# Patient Record
Sex: Female | Born: 1960 | Race: Black or African American | Hispanic: No | Smoking: Current every day smoker
Health system: Southern US, Community
[De-identification: ages and names within clinical notes are randomized; demographics above are authoritative.]

## PROBLEM LIST (undated history)

## (undated) DIAGNOSIS — A599 Trichomoniasis, unspecified: Secondary | ICD-10-CM

## (undated) DIAGNOSIS — I10 Essential (primary) hypertension: Secondary | ICD-10-CM

## (undated) DIAGNOSIS — D259 Leiomyoma of uterus, unspecified: Secondary | ICD-10-CM

## (undated) DIAGNOSIS — Z9889 Other specified postprocedural states: Secondary | ICD-10-CM

## (undated) DIAGNOSIS — H269 Unspecified cataract: Secondary | ICD-10-CM

## (undated) DIAGNOSIS — R112 Nausea with vomiting, unspecified: Secondary | ICD-10-CM

## (undated) HISTORY — DX: Essential (primary) hypertension: I10

## (undated) HISTORY — PX: ABDOMINAL HYSTERECTOMY: SHX81

## (undated) HISTORY — DX: Trichomoniasis, unspecified: A59.9

## (undated) HISTORY — PX: TUBAL LIGATION: SHX77

---

## 2010-11-21 DIAGNOSIS — A599 Trichomoniasis, unspecified: Secondary | ICD-10-CM

## 2010-11-21 HISTORY — DX: Trichomoniasis, unspecified: A59.9

## 2011-12-28 ENCOUNTER — Encounter (HOSPITAL_COMMUNITY): Payer: Self-pay | Admitting: Pharmacy Technician

## 2011-12-30 ENCOUNTER — Encounter (HOSPITAL_COMMUNITY): Payer: Self-pay | Admitting: Pharmacy Technician

## 2012-01-04 ENCOUNTER — Other Ambulatory Visit: Payer: Self-pay | Admitting: Obstetrics and Gynecology

## 2012-01-04 ENCOUNTER — Encounter: Payer: Self-pay | Admitting: Obstetrics and Gynecology

## 2012-01-04 NOTE — H&P (Signed)
Kim Richardson is an 51 y.o. female. She is admitted on 01/10/2012 for supracervical abdominal hysterectomy through a midline vertical incision due to symptomatic uterine fibroids. Plans are for preservation of the cervix as well as the tubes and ovaries unless distinct abnormalities are identified at surgery. She has had an episode of postmenopausal bleeding, with a negative endometrial biopsy. At last Pap smear was normal 2010 with no history of abnormal Paps normal Pap smear has been found in the chart dated 06/17/2011, also from [facility] .technical aspects of the procedure including plans for preservation of the ovaries and cervix unless abnormalities have been identified at the time of surgery. Patient has very specific in her comments that she "don't want no full hysterectomy" ultrasound has revealed multiple uterine fibroids. Uterine length was 16 cm maximum length., And patient complains of significant pressure symptoms from the fibroid enlargement of the uterus. Technical aspects of the procedure have been reviewed using [employer] instructional booklets   Pertinent Gynecological History: Menses: post-menopausal Bleeding: post menopausal bleeding with recent negative endometrial biopsy  Contraception: post menopausal status prior tubal ligation  DES exposure: denies Blood transfusions: none Sexually transmitted diseases: no past history Previous GYN Procedures: Cesarean section x3 with tubal ligation  Last mammogram: Records unavailable Date:  Last pap: normal Date:  06/17/2011 OB History: G3, P3   Menstrual History: Menarche age:  No LMP recorded.    No past medical history on file.  No past surgical history on file.  No family history on file.  Social History:  does not have a smoking history on file. She does not have any smokeless tobacco history on file. Her alcohol and drug histories not on file.  Allergies: No Known Allergies   (Not in a hospital  admission)  ROS  There were no vitals taken for this visit. Physical ExamPhysical Examination: General appearance - alert, well appearing, and in no distress, oriented to person, place, and time, overweight and weight 182 pounds Mental status - alert, oriented to person, place, and time, normal mood, behavior, speech, dress, motor activity, and thought processes, affect appropriate to mood Mouth - dental hygiene good and Good oral airway Neck - supple, no significant adenopathy Chest - clear to auscultation, no wheezes, rales or rhonchi, symmetric air entry Heart - normal rate and regular rhythm Abdomen - scars from previous incisions midline vertical under the umbilicus Pelvic - VULVA: normal appearing vulva with no masses, tenderness or lesions, VAGINA: normal appearing vagina with normal color and discharge, no lesions, CERVIX: normal appearing cervix without discharge or lesions, UTERUS: enlarged to 20+ week's size, irregular, mobile, ADNEXA: normal adnexa in size, nontender and no masses, exam limited by fibroid uterus, but negative on ultrasound,  Extremities - peripheral pulses normal, no pedal edema, no clubbing or cyanosis, Homan's sign negative bilaterally  No results found for this or any previous visit (from the past 24 hour(s)).  No results found.  Assessment/Plan:  dramatic uterine fibroids 20+ weeks uterine size, symptomatic due to pressure on adjacent organs, scheduled for supracervical abdominal hysterectomy with planned preservation of both ovaries and cervix provided: Normal findings at surgery. Planned procedure with potential complications such as bleeding and need for transfusions infection operative discomfort discussed with patient at preoperative evaluation  Priyal Musquiz V 01/04/2012, 8:39 AM

## 2012-01-05 ENCOUNTER — Other Ambulatory Visit: Payer: Self-pay

## 2012-01-05 ENCOUNTER — Encounter (HOSPITAL_COMMUNITY)
Admission: RE | Admit: 2012-01-05 | Discharge: 2012-01-05 | Disposition: A | Discharge status: Home / Self Care | Payer: Self-pay | Source: Ambulatory Visit | Attending: Obstetrics and Gynecology | Admitting: Obstetrics and Gynecology

## 2012-01-05 ENCOUNTER — Encounter (HOSPITAL_COMMUNITY): Payer: Self-pay

## 2012-01-05 HISTORY — DX: Nausea with vomiting, unspecified: Z98.890

## 2012-01-05 HISTORY — DX: Nausea with vomiting, unspecified: R11.2

## 2012-01-05 HISTORY — DX: Unspecified cataract: H26.9

## 2012-01-05 HISTORY — DX: Leiomyoma of uterus, unspecified: D25.9

## 2012-01-05 LAB — CBC
HCT: 43.4 % (ref 36.0–46.0)
Hemoglobin: 14.4 g/dL (ref 12.0–15.0)
MCH: 30.7 pg (ref 26.0–34.0)
MCV: 92.5 fL (ref 78.0–100.0)
Platelets: 234 10*3/uL (ref 150–400)
RBC: 4.69 MIL/uL (ref 3.87–5.11)

## 2012-01-05 LAB — TYPE AND SCREEN

## 2012-01-05 LAB — URINALYSIS, ROUTINE W REFLEX MICROSCOPIC
Nitrite: POSITIVE — AB
Specific Gravity, Urine: 1.025 (ref 1.005–1.030)
pH: 5.5 (ref 5.0–8.0)

## 2012-01-05 LAB — BASIC METABOLIC PANEL
CO2: 26 mEq/L (ref 19–32)
Calcium: 9.6 mg/dL (ref 8.4–10.5)
Glucose, Bld: 104 mg/dL — ABNORMAL HIGH (ref 70–99)
Sodium: 140 mEq/L (ref 135–145)

## 2012-01-05 LAB — SURGICAL PCR SCREEN
MRSA, PCR: NEGATIVE
Staphylococcus aureus: POSITIVE — AB

## 2012-01-05 LAB — URINE MICROSCOPIC-ADD ON

## 2012-01-05 NOTE — Patient Instructions (Signed)
19 Kim Richardson  01/05/2012   Your procedure is scheduled on:  Tuesday, 01/10/12  Report to [facility] at 0855 AM.  Call this number if you have problems the morning of surgery: [phone]   Remember:   Do not eat food:After Midnight.  May have clear liquids:until Midnight .  Clear liquids include soda, tea, black coffee, apple or grape juice, broth.  Take these medicines the morning of surgery with A SIP OF WATER: Avapro   Do not wear jewelry, make-up or nail polish.  Do not wear lotions, powders, or perfumes. You may wear deodorant.  Do not shave 48 hours prior to surgery.  Do not bring valuables to the hospital.  Contacts, dentures or bridgework may not be worn into surgery.  Leave suitcase in the car. After surgery it may be brought to your room.  For patients admitted to the hospital, checkout time is 11:00 AM the day of discharge.   Patients discharged the day of surgery will not be allowed to drive home.  Name and phone number of your driver: driver  Special Instructions: CHG Shower Use Special Wash: 1/2 bottle night before surgery and 1/2 bottle morning of surgery. Use bowel prep as prescribed by DR. Ferguson. [facility].)   Please read over the following fact sheets that you were given: Pain Booklet, MRSA Information, Surgical Site Infection Prevention, Anesthesia Post-op Instructions and Care and Recovery After Surgery   Supracervical Hysterectomy The uterus is the female organ that produces menstrual periods and carries a baby. The body of the uterus is the top part that is in the pelvis. The cervix (the opening or neck) is the bottom part that protrudes into the top of the vagina. A supracervical hysterectomy is the removal of the body of the uterus but not the cervix. This procedure can be performed with a large abdominal incision. It can also be done with a long tube with a light (laparoscopy) inserted into two small incisions in the lower abdomen. The tubes and ovaries can be  removed during this operation if necessary. You will not have menstrual periods or be able to get pregnant after having this procedure.  Women who are going to have this procedure should be tested to make sure they have a normal Pap test and no signs of precancer or cancer disease of the cervix or uterus. You should also know about the possibility of problems developing on the cervix later, which may need more surgery to remove the cervix.  Reasons this procedure is done:  Endometriosis. This is when the lining of the inside of the uterus is misplaced outside of its normal location.   Adenomyosis. This is when endometrial tissue goes in the muscle of the uterus.   Persistent abnormal bleeding.   Lasting (chronic) pelvic pain.   Uterine prolapse. This is when the uterus falls down into the vagina.  LET YOUR CAREGIVER KNOW ABOUT:  Allergies especially to any medications.   All the medications you are taking including over-the-counter medication, herbs, eye drops and creams.   Past problems with anesthesia or novocaine.   Possibility of being pregnant.   All past surgeries.   History of blood clots or bleeding problems.   Other medical problems (diabetes, kidney, heart or lung problems).  RISKS AND COMPLICATIONS  Blood clots of the leg, heart or lung.   Infection.   Severe bleeding.   Injury to other surrounding organs.   Early menopause.   Problems with the anesthesia.   More  surgery later to remove the cervix if you have problems with the cervix.  BEFORE THE PROCEDURE  Do not take aspirin or blood thinners a week before the surgery or as told by your caregiver.   Do not eat or drink anything the night before the surgery or as told by your caregiver.   Let your caregiver know if you develop a cold or any other infection.   If you are admitted the day of the surgery, show up 60 minutes before the surgery or as directed by your caregiver.  PROCEDURE  An intravenous  (IV) will be placed in your arm. You will be given an anesthetic to make you sleep during the surgery. You may be given a spinal anesthesia to numb your body from the waist down. When you wake up, you will still have the IV and also have a Foley catheter in you. A Foley catheter will drain your bladder for a day or two.  AFTER THE PROCEDURE  You will be taken to the recovery room for a couple of hours until it is safe to take you to your hospital room. Usually, you will remain in the hospital for 3 to 5 days. You may be given a medicine that kills germs (antibiotic) during and after the surgery. Pain medication will be ordered for you by your caregiver while you are in the hospital and for when you go home. HOME CARE INSTRUCTIONS  Healing will take time. You will have discomfort, tenderness, swelling and bruising at the operative site for a couple of weeks. This is normal and will get better as time goes on.   Only take over-the-counter or prescription medicines for pain, discomfort or fever as directed by your caregiver.   Do not take aspirin. It can cause bleeding.   Do not drive when taking pain medication.   Follow your caregiver's advice regarding diet, exercise, lifting, driving and general activities.   Resume your usual diet as directed and allowed.   Get plenty of rest and sleep.   Do not douche, use tampons, or have sexual intercourse until your caregiver gives you permission.   Change your bandages (dressings) as directed.   Take your temperature twice a day. Write it down.   Your caregiver may recommend showers instead of baths for a few weeks.   Do not drink alcohol until your caregiver gives you permission.   If you develop constipation, you may take a mild laxative with your caregiver's permission. Bran foods and drinking fluids helps with constipation problems.   Try to have someone home with you for a week or two to help with the household activities.   Make sure you  and your family understands everything about your operation and recovery.   Do not sign any legal documents until you feel normal again.   Keep all your follow-up appointments as recommended by your caregiver.  SEEK MEDICAL CARE IF:   There is swelling, redness or increasing pain in the wound area.   Pus is coming from the wound.   You notice a bad smell from the wound or surgical dressing.   You have pain, redness and swelling from the intravenous site.   The wound is breaking open (the edges are not staying together).   You feel dizzy or feel like fainting.   You develop pain or bleeding when you urinate.   You develop diarrhea.   You develop nausea and vomiting.   You develop abnormal vaginal discharge.  You develop a rash.   You have any type of abnormal reaction or develop an allergy to your medication.   You need stronger pain medication for your pain.  SEEK IMMEDIATE MEDICAL CARE IF:  You have an oral temperature above 102 F (38.9 C), not controlled by medicine.   You develop abdominal pain.   You develop chest pain.   You develop shortness of breath.   You pass out.   You develop pain, swelling or redness of your leg.   You develop heavy vaginal bleeding with or without blood clots.  Document Released: 04/25/2008 Document Revised: 03/04/2011 Document Reviewed: 03/25/2008 [facility] Patient Information 2012 [place], [place].

## 2012-01-10 ENCOUNTER — Encounter (HOSPITAL_COMMUNITY): Payer: Self-pay | Admitting: Anesthesiology

## 2012-01-10 ENCOUNTER — Encounter (HOSPITAL_COMMUNITY): Payer: Self-pay | Admitting: *Deleted

## 2012-01-10 ENCOUNTER — Inpatient Hospital Stay (HOSPITAL_COMMUNITY)
Admission: RE | Admit: 2012-01-10 | Discharge: 2012-01-12 | DRG: 743 | Disposition: A | Discharge status: Home / Self Care | Payer: Self-pay | Source: Ambulatory Visit | Attending: Obstetrics and Gynecology | Admitting: Obstetrics and Gynecology

## 2012-01-10 ENCOUNTER — Other Ambulatory Visit: Payer: Self-pay | Admitting: Obstetrics and Gynecology

## 2012-01-10 ENCOUNTER — Inpatient Hospital Stay (HOSPITAL_COMMUNITY): Payer: Self-pay | Admitting: Anesthesiology

## 2012-01-10 ENCOUNTER — Encounter (HOSPITAL_COMMUNITY)
Admission: RE | Disposition: A | Discharge status: Home / Self Care | Payer: Self-pay | Source: Ambulatory Visit | Attending: Obstetrics and Gynecology

## 2012-01-10 DIAGNOSIS — D259 Leiomyoma of uterus, unspecified: Secondary | ICD-10-CM | POA: Diagnosis present

## 2012-01-10 DIAGNOSIS — Z90710 Acquired absence of both cervix and uterus: Secondary | ICD-10-CM

## 2012-01-10 DIAGNOSIS — F17200 Nicotine dependence, unspecified, uncomplicated: Secondary | ICD-10-CM | POA: Diagnosis present

## 2012-01-10 DIAGNOSIS — D251 Intramural leiomyoma of uterus: Principal | ICD-10-CM | POA: Diagnosis present

## 2012-01-10 DIAGNOSIS — Z01812 Encounter for preprocedural laboratory examination: Secondary | ICD-10-CM

## 2012-01-10 DIAGNOSIS — I10 Essential (primary) hypertension: Secondary | ICD-10-CM | POA: Diagnosis present

## 2012-01-10 DIAGNOSIS — Z01810 Encounter for preprocedural cardiovascular examination: Secondary | ICD-10-CM

## 2012-01-10 DIAGNOSIS — N950 Postmenopausal bleeding: Secondary | ICD-10-CM | POA: Diagnosis present

## 2012-01-10 HISTORY — PX: SUPRACERVICAL ABDOMINAL HYSTERECTOMY: SHX5393

## 2012-01-10 SURGERY — HYSTERECTOMY, SUPRACERVICAL, ABDOMINAL
Anesthesia: General | Site: Uterus | Wound class: Clean Contaminated

## 2012-01-10 MED ORDER — ROCURONIUM BROMIDE 50 MG/5ML IV SOLN
INTRAVENOUS | Status: AC
  Filled 2012-01-10: qty 2

## 2012-01-10 MED ORDER — PROPOFOL 10 MG/ML IV BOLUS
INTRAVENOUS | Status: DC | PRN
  Administered 2012-01-10: 160 mg via INTRAVENOUS

## 2012-01-10 MED ORDER — ONDANSETRON HCL 4 MG/2ML IJ SOLN: 4.0000 mg | Freq: Once | INTRAMUSCULAR | Status: DC | PRN

## 2012-01-10 MED ORDER — CEFAZOLIN SODIUM 1-5 GM-% IV SOLN
INTRAVENOUS | Status: DC | PRN
  Administered 2012-01-10: 1 g via INTRAVENOUS

## 2012-01-10 MED ORDER — LABETALOL HCL 5 MG/ML IV SOLN
10.0000 mg | INTRAVENOUS | Status: DC | PRN
  Filled 2012-01-10: qty 4

## 2012-01-10 MED ORDER — ROCURONIUM BROMIDE 50 MG/5ML IV SOLN
INTRAVENOUS | Status: AC
  Filled 2012-01-10: qty 1

## 2012-01-10 MED ORDER — FENTANYL CITRATE 0.05 MG/ML IJ SOLN
INTRAMUSCULAR | Status: DC | PRN
  Administered 2012-01-10: 100 ug via INTRAVENOUS
  Administered 2012-01-10: 50 ug via INTRAVENOUS
  Administered 2012-01-10: 100 ug via INTRAVENOUS

## 2012-01-10 MED ORDER — MUPIROCIN 2 % EX OINT
TOPICAL_OINTMENT | CUTANEOUS | Status: AC
  Administered 2012-01-10: 1
  Filled 2012-01-10: qty 22

## 2012-01-10 MED ORDER — MIDAZOLAM HCL 2 MG/2ML IJ SOLN
INTRAMUSCULAR | Status: AC
  Filled 2012-01-10: qty 2

## 2012-01-10 MED ORDER — DEXAMETHASONE SODIUM PHOSPHATE 4 MG/ML IJ SOLN
INTRAMUSCULAR | Status: AC
  Filled 2012-01-10: qty 2

## 2012-01-10 MED ORDER — DEXAMETHASONE SODIUM PHOSPHATE 4 MG/ML IJ SOLN
4.0000 mg | Freq: Once | INTRAMUSCULAR | Status: AC
  Administered 2012-01-10: 4 mg via INTRAVENOUS

## 2012-01-10 MED ORDER — HYDROMORPHONE BOLUS VIA INFUSION
0.5000 mg | INTRAVENOUS | Status: DC | PRN
  Administered 2012-01-10 (×2): 0.5 mg via INTRAVENOUS
  Filled 2012-01-10: qty 1

## 2012-01-10 MED ORDER — PANTOPRAZOLE SODIUM 40 MG IV SOLR
40.0000 mg | Freq: Every day | INTRAVENOUS | Status: DC
  Administered 2012-01-10 – 2012-01-11 (×3): 40 mg via INTRAVENOUS
  Filled 2012-01-10 (×2): qty 40

## 2012-01-10 MED ORDER — FENTANYL CITRATE 0.05 MG/ML IJ SOLN
INTRAMUSCULAR | Status: AC
  Administered 2012-01-10: 50 ug via INTRAVENOUS
  Filled 2012-01-10: qty 5

## 2012-01-10 MED ORDER — HYDROMORPHONE 0.3 MG/ML IV SOLN
INTRAVENOUS | Status: AC
  Filled 2012-01-10: qty 25

## 2012-01-10 MED ORDER — ONDANSETRON HCL 4 MG/2ML IJ SOLN: 4.0000 mg | Freq: Four times a day (QID) | INTRAMUSCULAR | Status: DC | PRN

## 2012-01-10 MED ORDER — SCOPOLAMINE 1 MG/3DAYS TD PT72
MEDICATED_PATCH | TRANSDERMAL | Status: AC
  Filled 2012-01-10: qty 1

## 2012-01-10 MED ORDER — DOCUSATE SODIUM 100 MG PO CAPS
100.0000 mg | ORAL_CAPSULE | Freq: Two times a day (BID) | ORAL | Status: DC
  Administered 2012-01-10 – 2012-01-12 (×5): 100 mg via ORAL
  Filled 2012-01-10 (×4): qty 1

## 2012-01-10 MED ORDER — DEXAMETHASONE SODIUM PHOSPHATE 4 MG/ML IJ SOLN
INTRAMUSCULAR | Status: DC | PRN
  Administered 2012-01-10: 8 mg via INTRAVENOUS

## 2012-01-10 MED ORDER — ZOLPIDEM TARTRATE 5 MG PO TABS: 5.0000 mg | ORAL_TABLET | Freq: Every evening | ORAL | Status: DC | PRN

## 2012-01-10 MED ORDER — SCOPOLAMINE 1 MG/3DAYS TD PT72
1.0000 | MEDICATED_PATCH | Freq: Once | TRANSDERMAL | Status: DC
  Administered 2012-01-10: 1.5 mg via TRANSDERMAL

## 2012-01-10 MED ORDER — ALBUTEROL SULFATE HFA 108 (90 BASE) MCG/ACT IN AERS: 2.0000 | INHALATION_SPRAY | RESPIRATORY_TRACT | Status: DC | PRN

## 2012-01-10 MED ORDER — LACTATED RINGERS IV SOLN
INTRAVENOUS | Status: DC
  Administered 2012-01-10 (×3): via INTRAVENOUS

## 2012-01-10 MED ORDER — CEFAZOLIN SODIUM-DEXTROSE 2-3 GM-% IV SOLR
2.0000 g | INTRAVENOUS | Status: AC
  Administered 2012-01-10: 2 g via INTRAVENOUS

## 2012-01-10 MED ORDER — KETOROLAC TROMETHAMINE 30 MG/ML IJ SOLN
30.0000 mg | Freq: Once | INTRAMUSCULAR | Status: AC
  Administered 2012-01-10: 30 mg via INTRAVENOUS

## 2012-01-10 MED ORDER — MIDAZOLAM HCL 2 MG/2ML IJ SOLN
INTRAMUSCULAR | Status: AC
  Administered 2012-01-10: 2 mg via INTRAVENOUS
  Filled 2012-01-10: qty 2

## 2012-01-10 MED ORDER — GLYCOPYRROLATE 0.2 MG/ML IJ SOLN
INTRAMUSCULAR | Status: DC | PRN
  Administered 2012-01-10 (×2): .2 mg via INTRAVENOUS

## 2012-01-10 MED ORDER — POTASSIUM CHLORIDE IN NACL 20-0.9 MEQ/L-% IV SOLN
INTRAVENOUS | Status: DC
  Administered 2012-01-10 – 2012-01-11 (×2): via INTRAVENOUS

## 2012-01-10 MED ORDER — ROCURONIUM BROMIDE 100 MG/10ML IV SOLN
INTRAVENOUS | Status: DC | PRN
  Administered 2012-01-10 (×2): 10 mg via INTRAVENOUS
  Administered 2012-01-10: 40 mg via INTRAVENOUS

## 2012-01-10 MED ORDER — EPHEDRINE SULFATE 50 MG/ML IJ SOLN
INTRAMUSCULAR | Status: DC | PRN
  Administered 2012-01-10: 10 mg via INTRAVENOUS

## 2012-01-10 MED ORDER — PROPOFOL 10 MG/ML IV EMUL
INTRAVENOUS | Status: AC
  Filled 2012-01-10: qty 20

## 2012-01-10 MED ORDER — CEFAZOLIN SODIUM-DEXTROSE 2-3 GM-% IV SOLR
INTRAVENOUS | Status: AC
  Administered 2012-01-10: 2 g via INTRAVENOUS
  Filled 2012-01-10: qty 50

## 2012-01-10 MED ORDER — KETOROLAC TROMETHAMINE 30 MG/ML IJ SOLN: 30.0000 mg | Freq: Four times a day (QID) | INTRAMUSCULAR | Status: AC

## 2012-01-10 MED ORDER — FENTANYL CITRATE 0.05 MG/ML IJ SOLN
25.0000 ug | INTRAMUSCULAR | Status: DC | PRN
  Administered 2012-01-10 (×4): 50 ug via INTRAVENOUS

## 2012-01-10 MED ORDER — FENTANYL CITRATE 0.05 MG/ML IJ SOLN
INTRAMUSCULAR | Status: AC
  Administered 2012-01-10: 50 ug via INTRAVENOUS
  Filled 2012-01-10: qty 2

## 2012-01-10 MED ORDER — SODIUM CHLORIDE 0.9 % IJ SOLN: 9.0000 mL | INTRAMUSCULAR | Status: DC | PRN

## 2012-01-10 MED ORDER — HYDROMORPHONE 0.3 MG/ML IV SOLN
INTRAVENOUS | Status: DC
  Administered 2012-01-10: 16:00:00 via INTRAVENOUS
  Administered 2012-01-10: 0.6 mg via INTRAVENOUS
  Administered 2012-01-11: 0.3 mL via INTRAVENOUS
  Administered 2012-01-11: 1.2 mL via INTRAVENOUS

## 2012-01-10 MED ORDER — HYDROMORPHONE HCL PF 1 MG/ML IJ SOLN
INTRAMUSCULAR | Status: AC
  Filled 2012-01-10: qty 1

## 2012-01-10 MED ORDER — ONDANSETRON HCL 4 MG/2ML IJ SOLN
4.0000 mg | Freq: Once | INTRAMUSCULAR | Status: AC
  Administered 2012-01-10: 4 mg via INTRAVENOUS

## 2012-01-10 MED ORDER — ONDANSETRON HCL 4 MG PO TABS: 4.0000 mg | ORAL_TABLET | Freq: Four times a day (QID) | ORAL | Status: DC | PRN

## 2012-01-10 MED ORDER — DEXAMETHASONE SODIUM PHOSPHATE 4 MG/ML IJ SOLN
INTRAMUSCULAR | Status: AC
  Filled 2012-01-10: qty 1

## 2012-01-10 MED ORDER — ONDANSETRON HCL 4 MG/2ML IJ SOLN
INTRAMUSCULAR | Status: AC
  Administered 2012-01-10: 4 mg via INTRAVENOUS
  Filled 2012-01-10: qty 2

## 2012-01-10 MED ORDER — MUPIROCIN CALCIUM 2 % EX CREA: TOPICAL_CREAM | Freq: Two times a day (BID) | CUTANEOUS | Status: DC

## 2012-01-10 MED ORDER — MIDAZOLAM HCL 5 MG/5ML IJ SOLN
INTRAMUSCULAR | Status: DC | PRN
  Administered 2012-01-10: 2 mg via INTRAVENOUS

## 2012-01-10 MED ORDER — DIPHENHYDRAMINE HCL 50 MG/ML IJ SOLN: 12.5000 mg | Freq: Four times a day (QID) | INTRAMUSCULAR | Status: DC | PRN

## 2012-01-10 MED ORDER — MIDAZOLAM HCL 2 MG/2ML IJ SOLN
1.0000 mg | INTRAMUSCULAR | Status: DC | PRN
  Administered 2012-01-10: 2 mg via INTRAVENOUS

## 2012-01-10 MED ORDER — SIMETHICONE 80 MG PO CHEW: 80.0000 mg | CHEWABLE_TABLET | Freq: Four times a day (QID) | ORAL | Status: DC | PRN

## 2012-01-10 MED ORDER — DIPHENHYDRAMINE HCL 12.5 MG/5ML PO ELIX
12.5000 mg | ORAL_SOLUTION | Freq: Four times a day (QID) | ORAL | Status: DC | PRN
  Administered 2012-01-11: 12.5 mg via ORAL
  Filled 2012-01-10: qty 5

## 2012-01-10 MED ORDER — 0.9 % SODIUM CHLORIDE (POUR BTL) OPTIME
TOPICAL | Status: DC | PRN
  Administered 2012-01-10: 1000 mL

## 2012-01-10 MED ORDER — KETOROLAC TROMETHAMINE 30 MG/ML IJ SOLN
INTRAMUSCULAR | Status: AC
  Filled 2012-01-10: qty 1

## 2012-01-10 MED ORDER — KETOROLAC TROMETHAMINE 30 MG/ML IJ SOLN
30.0000 mg | Freq: Four times a day (QID) | INTRAMUSCULAR | Status: AC
  Administered 2012-01-10 – 2012-01-11 (×5): 30 mg via INTRAVENOUS
  Filled 2012-01-10 (×5): qty 1

## 2012-01-10 MED ORDER — OLMESARTAN MEDOXOMIL 20 MG PO TABS: 40.0000 mg | ORAL_TABLET | Freq: Every day | ORAL | Status: DC

## 2012-01-10 MED ORDER — NALOXONE HCL 0.4 MG/ML IJ SOLN: 0.4000 mg | INTRAMUSCULAR | Status: DC | PRN

## 2012-01-10 MED ORDER — NEOSTIGMINE METHYLSULFATE 1 MG/ML IJ SOLN
INTRAMUSCULAR | Status: DC | PRN
  Administered 2012-01-10: 3 mg via INTRAVENOUS

## 2012-01-10 MED ORDER — ONDANSETRON HCL 4 MG/2ML IJ SOLN
4.0000 mg | Freq: Four times a day (QID) | INTRAMUSCULAR | Status: DC | PRN
  Administered 2012-01-10: 4 mg via INTRAVENOUS
  Filled 2012-01-10: qty 2

## 2012-01-10 MED ORDER — LIDOCAINE HCL 1 % IJ SOLN
INTRAMUSCULAR | Status: DC | PRN
  Administered 2012-01-10: 25 mg via INTRADERMAL

## 2012-01-10 MED ORDER — MUPIROCIN 2 % EX OINT: 1.0000 "application " | TOPICAL_OINTMENT | Freq: Two times a day (BID) | CUTANEOUS | Status: DC

## 2012-01-10 SURGICAL SUPPLY — 55 items
BAG HAMPER (MISCELLANEOUS) ×2 IMPLANT
BENZOIN TINCTURE PRP APPL 2/3 (GAUZE/BANDAGES/DRESSINGS) IMPLANT
CLOSURE STERI STRIP 1/2 X4 (GAUZE/BANDAGES/DRESSINGS) ×2 IMPLANT
CLOTH BEACON ORANGE TIMEOUT ST (SAFETY) ×2 IMPLANT
COVER LIGHT HANDLE STERIS (MISCELLANEOUS) ×4 IMPLANT
DRAPE WARM FLUID 44X44 (DRAPE) ×2 IMPLANT
DRESSING TELFA 8X3 (GAUZE/BANDAGES/DRESSINGS) ×2 IMPLANT
ELECT BLADE 6 FLAT ULTRCLN (ELECTRODE) ×2 IMPLANT
ELECT REM PT RETURN 9FT ADLT (ELECTROSURGICAL) ×2
ELECTRODE REM PT RTRN 9FT ADLT (ELECTROSURGICAL) ×1 IMPLANT
EVACUATOR DRAINAGE 10X20 100CC (DRAIN) IMPLANT
EVACUATOR SILICONE 100CC (DRAIN)
FORMALIN 10 PREFIL 480ML (MISCELLANEOUS) IMPLANT
GLOVE BIOGEL PI IND STRL 6.5 (GLOVE) ×1 IMPLANT
GLOVE BIOGEL PI IND STRL 7.0 (GLOVE) ×2 IMPLANT
GLOVE BIOGEL PI IND STRL 9 (GLOVE) ×1 IMPLANT
GLOVE BIOGEL PI INDICATOR 6.5 (GLOVE) ×1
GLOVE BIOGEL PI INDICATOR 7.0 (GLOVE) ×2
GLOVE BIOGEL PI INDICATOR 9 (GLOVE) ×1
GLOVE ECLIPSE 6.5 STRL STRAW (GLOVE) ×6 IMPLANT
GLOVE ECLIPSE 9.0 STRL (GLOVE) ×2 IMPLANT
GLOVE EXAM NITRILE MD LF STRL (GLOVE) ×2 IMPLANT
GOWN STRL REIN 3XL LVL4 (GOWN DISPOSABLE) ×2 IMPLANT
GOWN STRL REIN XL XLG (GOWN DISPOSABLE) ×6 IMPLANT
INST SET MAJOR GENERAL (KITS) ×2 IMPLANT
KIT ROOM TURNOVER APOR (KITS) ×2 IMPLANT
MANIFOLD NEPTUNE II (INSTRUMENTS) ×2 IMPLANT
NS IRRIG 1000ML POUR BTL (IV SOLUTION) ×4 IMPLANT
PACK ABDOMINAL MAJOR (CUSTOM PROCEDURE TRAY) ×2 IMPLANT
PAD ARMBOARD 7.5X6 YLW CONV (MISCELLANEOUS) ×2 IMPLANT
RETRACTOR WND ALEXIS 25 LRG (MISCELLANEOUS) IMPLANT
RTRCTR WOUND ALEXIS 25CM LRG (MISCELLANEOUS)
SET BASIN LINEN APH (SET/KITS/TRAYS/PACK) ×2 IMPLANT
SOL PREP PROV IODINE SCRUB 4OZ (MISCELLANEOUS) ×2 IMPLANT
SPONGE DRAIN TRACH 4X4 STRL 2S (GAUZE/BANDAGES/DRESSINGS) IMPLANT
SPONGE GAUZE 4X4 12PLY (GAUZE/BANDAGES/DRESSINGS) IMPLANT
SPONGE LAP 18X18 X RAY DECT (DISPOSABLE) IMPLANT
STAPLER VISISTAT 35W (STAPLE) IMPLANT
STRIP CLOSURE SKIN 1/2X4 (GAUZE/BANDAGES/DRESSINGS) IMPLANT
SUT CHROMIC 0 CT 1 (SUTURE) ×26 IMPLANT
SUT CHROMIC 2 0 CT 1 (SUTURE) ×4 IMPLANT
SUT CHROMIC GUT BROWN 0 54 (SUTURE) IMPLANT
SUT CHROMIC GUT BROWN 0 54IN (SUTURE)
SUT ETHILON 3 0 FSL (SUTURE) IMPLANT
SUT PDS AB CT VIOLET #0 27IN (SUTURE) ×2 IMPLANT
SUT PLAIN CT 1/2CIR 2-0 27IN (SUTURE) ×4 IMPLANT
SUT PROLENE 0 CT 1 30 (SUTURE) IMPLANT
SUT VIC AB 0 CT1 27 (SUTURE) ×1
SUT VIC AB 0 CT1 27XBRD ANTBC (SUTURE) ×1 IMPLANT
SUT VICRYL 4 0 KS 27 (SUTURE) ×2 IMPLANT
SUT VICRYL AB 2 0 TIES (SUTURE) IMPLANT
TAPE CLOTH SURG 4X10 WHT LF (GAUZE/BANDAGES/DRESSINGS) ×2 IMPLANT
TOWEL BLUE STERILE X RAY DET (MISCELLANEOUS) ×2 IMPLANT
TOWEL OR 17X26 4PK STRL BLUE (TOWEL DISPOSABLE) ×2 IMPLANT
TRAY FOLEY CATH 14FR (SET/KITS/TRAYS/PACK) ×2 IMPLANT

## 2012-01-10 NOTE — H&P (View-Only) (Signed)
Kim Richardson is an 51 y.o. female. She is admitted on 01/10/2012 for supracervical abdominal hysterectomy through a midline vertical incision due to symptomatic uterine fibroids. Plans are for preservation of the cervix as well as the tubes and ovaries unless distinct abnormalities are identified at surgery. She has had an episode of postmenopausal bleeding, with a negative endometrial biopsy. At last Pap smear was normal 2010 with no history of abnormal Paps normal Pap smear has been found in the chart dated 06/17/2011, also from [facility] .technical aspects of the procedure including plans for preservation of the ovaries and cervix unless abnormalities have been identified at the time of surgery. Patient has very specific in her comments that she "don't want no full hysterectomy" ultrasound has revealed multiple uterine fibroids. Uterine length was 16 cm maximum length., And patient complains of significant pressure symptoms from the fibroid enlargement of the uterus. Technical aspects of the procedure have been reviewed using [employer] instructional booklets   Pertinent Gynecological History: Menses: post-menopausal Bleeding: post menopausal bleeding with recent negative endometrial biopsy  Contraception: post menopausal status prior tubal ligation  DES exposure: denies Blood transfusions: none Sexually transmitted diseases: no past history Previous GYN Procedures: Cesarean section x3 with tubal ligation  Last mammogram: Records unavailable Date:  Last pap: normal Date:  06/17/2011 OB History: G3, P3   Menstrual History: Menarche age:  No LMP recorded.    No past medical history on file.  No past surgical history on file.  No family history on file.  Social History:  does not have a smoking history on file. She does not have any smokeless tobacco history on file. Her alcohol and drug histories not on file.  Allergies: No Known Allergies   (Not in a hospital  admission)  ROS  There were no vitals taken for this visit. Physical ExamPhysical Examination: General appearance - alert, well appearing, and in no distress, oriented to person, place, and time, overweight and weight 182 pounds Mental status - alert, oriented to person, place, and time, normal mood, behavior, speech, dress, motor activity, and thought processes, affect appropriate to mood Mouth - dental hygiene good and Good oral airway Neck - supple, no significant adenopathy Chest - clear to auscultation, no wheezes, rales or rhonchi, symmetric air entry Heart - normal rate and regular rhythm Abdomen - scars from previous incisions midline vertical under the umbilicus Pelvic - VULVA: normal appearing vulva with no masses, tenderness or lesions, VAGINA: normal appearing vagina with normal color and discharge, no lesions, CERVIX: normal appearing cervix without discharge or lesions, UTERUS: enlarged to 20+ week's size, irregular, mobile, ADNEXA: normal adnexa in size, nontender and no masses, exam limited by fibroid uterus, but negative on ultrasound,  Extremities - peripheral pulses normal, no pedal edema, no clubbing or cyanosis, Homan's sign negative bilaterally  No results found for this or any previous visit (from the past 24 hour(s)).  No results found.  Assessment/Plan:  dramatic uterine fibroids 20+ weeks uterine size, symptomatic due to pressure on adjacent organs, scheduled for supracervical abdominal hysterectomy with planned preservation of both ovaries and cervix provided: Normal findings at surgery. Planned procedure with potential complications such as bleeding and need for transfusions infection operative discomfort discussed with patient at preoperative evaluation  Shonda Mandarino V 01/04/2012, 8:39 AM  

## 2012-01-10 NOTE — Anesthesia Procedure Notes (Signed)
Procedure Name: Intubation Date/Time: 01/10/2012 10:38 AM Performed by: Despina Hidden Pre-anesthesia Checklist: Suction available, Emergency Drugs available, Patient identified and Patient being monitored Patient Re-evaluated:Patient Re-evaluated prior to inductionOxygen Delivery Method: Circle System Utilized Preoxygenation: Pre-oxygenation with 100% oxygen Intubation Type: IV induction Ventilation: Mask ventilation without difficulty Laryngoscope Size: Gowans, 2, Mac and 3 Grade View: Grade III Tube type: Oral Tube size: 7.0 mm Number of attempts: 3 Airway Equipment and Method: stylet and patient positioned with wedge pillow Placement Confirmation: positive ETCO2 and breath sounds checked- equal and bilateral Secured at: 22 cm Tube secured with: Tape Dental Injury: Teeth and Oropharynx as per pre-operative assessment  Difficulty Due To: Difficulty was unanticipated, Difficult Airway- due to anterior larynx and Difficult Airway- due to dentition Future Recommendations: Recommend- induction with short-acting agent, and alternative techniques readily available Comments: Small glidescope used;vigorous cricoid needed

## 2012-01-10 NOTE — Interval H&P Note (Signed)
History and Physical Interval Note:  01/10/2012 10:09 AM  Kim Richardson  has presented today for surgery, with the diagnosis of uterine fibroids postmenopausal bleeding  The various methods of treatment have been discussed with the patient and family. After consideration of risks, benefits and other options for treatment, the patient has consented to  Procedure(s) (LRB): HYSTERECTOMY SUPRACERVICAL ABDOMINAL (N/A) as a surgical intervention .  The patients' history has been reviewed, patient examined, no change in status, stable for surgery.  I have reviewed the patients' chart and labs.  Questions were answered to the patient's satisfaction.     Kim Richardson V  Patient took bowel prep, and is agreed with planned surgery .   No changes in patient condition or surgical plan.

## 2012-01-10 NOTE — Plan of Care (Signed)
Problem: Phase I Progression Outcomes Goal: Pain controlled with appropriate interventions Outcome: Progressing Pt on PCA pump at this time

## 2012-01-10 NOTE — Anesthesia Postprocedure Evaluation (Signed)
  Anesthesia Post-op Note  Patient: Kim Richardson  Procedure(s) Performed: Procedure(s) (LRB): HYSTERECTOMY SUPRACERVICAL ABDOMINAL (N/A)  Patient Location: PACU  Anesthesia Type: General  Level of Consciousness: awake, alert , oriented and patient cooperative  Airway and Oxygen Therapy: Patient Spontanous Breathing  Post-op Pain: 4 /10, moderate  Post-op Assessment: Post-op Vital signs reviewed, Patient's Cardiovascular Status Stable, Respiratory Function Stable, Patent Airway and No signs of Nausea or vomiting  Post-op Vital Signs: Reviewed and stable  Complications: No apparent anesthesia complications

## 2012-01-10 NOTE — Brief Op Note (Signed)
01/10/2012  12:13 PM  PATIENT:  Kim Richardson  51 y.o. female  PRE-OPERATIVE DIAGNOSIS:  uterine fibroids postmenopausal bleeding  POST-OPERATIVE DIAGNOSIS:  uterine fibroids postmenopausal bleeding  PROCEDURE:  Procedure(s) (LRB): HYSTERECTOMY SUPRACERVICAL ABDOMINAL (N/A)  SURGEON:  Surgeon(s) and Role:    * Tilda Burrow, MD - Primary  PHYSICIAN ASSISTANT:   ASSISTANTSMorrie Sheldon, RNFA   ANESTHESIA:   spinal, no general  EBL:  Total I/O In: 1000 [I.V.:1000] Out: 350 [Urine:100; Blood:250]  BLOOD ADMINISTERED:none  DRAINS: Urinary Catheter (Foley)   LOCAL MEDICATIONS USED:  NONE  SPECIMEN:  Source of Specimen:  uterus and left ovary  DISPOSITION OF SPECIMEN:  PATHOLOGY  COUNTS:  YES  TOURNIQUET:  * No tourniquets in log *  DICTATION: .Dragon Dictation She was in the operating room, intubated with some challenge due to airway access as described in anesthesia notes. Abdomen was prepped and draped with Foley catheter in place and vaginal prepping as well. Slightly flexed on the pelvis. Timeout was conducted and confirmed by surgical team. Ancef 1 g IV administered. Midline vertical incision from umbilicus to symphysis pubis was repeated with sharp dissection excision of the old cicatrix fascia was opened carefully in the midline and omental adhesions to the anterior peritoneal surface were sharply dissected and confirmed as hemostatic. For retractor was positioned laparotomy tapes in place to hold bowel away and attention to the very elongated irregular uterus which had a anterior fundal fibroid as well as the anterior lower uterine segment fibroid deforming as. Old cesarean section scar and was identifiable. Round ligament was taken down on the left, the left utero-ovarian ligament identified with left ovary densely adherent to the backside of the uterus and therefore not necessary be sacrificed. The ligament could be clamped cut and suture ligated close to the uterus and  ovary, staying well out of the retroperitoneum and away from the ureter. 0 chromic suture ligature was utilized. On the side round ligament could be taken down in the left IP ligament could be identified as well as the left utero-ovarian ligament and the left ovary could be salvaged as per patient request. Right uterine vessels could be clamped but visibility was poor so morcellation of the uterus then followed with anterior lower uterine segment fibroid shelled out and the upper half of the uterus amputated off. Uterine vessels could then be easily visualized clamped on the right and transected and suture-ligated clubbing sure the bladder was well out of the way. Upper cardinal ligaments were clamped cut and suture ligated using straight Heaney clamp knife dissection and 0 chromic suture ligature, then the lower uterine segment extended off the cervical portion and then the cervix closed with front to back interrupted sutures x3. Hemostasis was satisfactory. Pelvis was irrigated. The peritoneum from the bladder edge of the vesicouterine space was then sewed to the cervical stump loosely. Pelvis was irrigated and pedicles confirmed as hemostatic, removed, anterior and couldn't be dissected free from its adhesions to the fashion and was closed as a separate layer using 2-0 chromic. The fascia was closed in a running closure with 0 PDS, the subcutaneous tissues were irrigated copiously reapproximated with interrupted horizontal mattress sutures of the skin and then subcuticular 4-0 Vicryl skin closure completed the procedure sponge and needle counts correct were correct and patient allowed to go recovery room with Steri-Strips applied dressing in place.  Addendum: patient will be given albuterol inhaler treatment s an option PRN for any shortness of breath during the postop phase.  PLAN OF CARE: Admit to inpatient   PATIENT DISPOSITION:  PACU - hemodynamically stable.   Delay start of Pharmacological VTE agent  (>24hrs) due to surgical blood loss or risk of bleeding: not applicable

## 2012-01-10 NOTE — Transfer of Care (Signed)
Immediate Anesthesia Transfer of Care Note  Patient: Kim Richardson  Procedure(s) Performed: Procedure(s) (LRB): HYSTERECTOMY SUPRACERVICAL ABDOMINAL (N/A)  Patient Location: PACU  Anesthesia Type: General  Level of Consciousness: awake and patient cooperative  Airway & Oxygen Therapy: Patient Spontanous Breathing and Patient connected to face mask oxygen  Post-op Assessment: Report given to PACU RN, Post -op Vital signs reviewed and stable and Patient moving all extremities  Post vital signs: Reviewed and stable  Complications: No apparent anesthesia complications

## 2012-01-10 NOTE — Op Note (Signed)
See detailed dictation included in the brief operative note.

## 2012-01-10 NOTE — Progress Notes (Signed)
Pt arrived from surgery at 1500; pt states she is still in pain; notified Dr. Emelda Fear, ordered Diluadid PCA; PCA set up and instructions given to patient; reassessed patient at 1700 and stated her pain was doing much better; will continue to monitor for pain/problems.

## 2012-01-10 NOTE — Anesthesia Preprocedure Evaluation (Signed)
Anesthesia Evaluation  Patient identified by MRN, date of birth, ID band Patient awake    Reviewed: Allergy & Precautions, H&P , NPO status , Patient's Chart, lab work & pertinent test results  History of Anesthesia Complications (+) PONV  Airway Mallampati: II      Dental  (+) Teeth Intact   Pulmonary Current Smoker,  clear to auscultation        Cardiovascular hypertension, Pt. on medications Regular Normal    Neuro/Psych    GI/Hepatic   Endo/Other    Renal/GU      Musculoskeletal   Abdominal   Peds  Hematology   Anesthesia Other Findings   Reproductive/Obstetrics                           Anesthesia Physical Anesthesia Plan  ASA: II  Anesthesia Plan: General   Post-op Pain Management:    Induction: Intravenous  Airway Management Planned: Oral ETT  Additional Equipment:   Intra-op Plan:   Post-operative Plan: Extubation in OR  Informed Consent: I have reviewed the patients History and Physical, chart, labs and discussed the procedure including the risks, benefits and alternatives for the proposed anesthesia with the patient or authorized representative who has indicated his/her understanding and acceptance.     Plan Discussed with:   Anesthesia Plan Comments:         Anesthesia Quick Evaluation

## 2012-01-11 LAB — BASIC METABOLIC PANEL
BUN: 8 mg/dL (ref 6–23)
CO2: 27 mEq/L (ref 19–32)
Calcium: 9 mg/dL (ref 8.4–10.5)
Chloride: 105 mEq/L (ref 96–112)
Creatinine, Ser: 0.85 mg/dL (ref 0.50–1.10)
Glucose, Bld: 114 mg/dL — ABNORMAL HIGH (ref 70–99)

## 2012-01-11 LAB — CBC
HCT: 37.7 % (ref 36.0–46.0)
Hemoglobin: 12.5 g/dL (ref 12.0–15.0)
MCH: 31.3 pg (ref 26.0–34.0)
MCHC: 33.2 g/dL (ref 30.0–36.0)
MCV: 94.5 fL (ref 78.0–100.0)
Platelets: 219 K/uL (ref 150–400)
RBC: 3.99 MIL/uL (ref 3.87–5.11)
RDW: 12.8 % (ref 11.5–15.5)
WBC: 10.7 K/uL — ABNORMAL HIGH (ref 4.0–10.5)

## 2012-01-11 MED ORDER — OXYCODONE-ACETAMINOPHEN 5-325 MG PO TABS
1.0000 | ORAL_TABLET | ORAL | Status: DC | PRN
  Administered 2012-01-11: 2 via ORAL
  Administered 2012-01-11 (×2): 1 via ORAL
  Administered 2012-01-12 (×2): 2 via ORAL
  Filled 2012-01-11 (×4): qty 2

## 2012-01-11 NOTE — Progress Notes (Signed)
1 Day Post-Op Procedure(s) (LRB): HYSTERECTOMY SUPRACERVICAL ABDOMINAL (N/A)  Subjective: Patient reports tolerating PO and no problems voiding.  Hungry, did well on p.o yesterday. Pain minimal.  Objective: I have reviewed patient's vital signs, medications and labs.  General: alert, cooperative and no distress Resp: clear to auscultation bilaterally GI: soft, non-tender; bowel sounds normal; no masses,  no organomegalyTemp:  [98.1 F (36.7 C)-98.7 F (37.1 C)] 98.7 F (37.1 C) (02/20 0613) Pulse Rate:  [55-77] 63  (02/20 0613) Resp:  [16-37] 18  (02/20 0613) BP: (116-173)/(71-99) 116/71 mmHg (02/20 0613) SpO2:  [90 %-100 %] 97 % (02/20 0613) Weight:  [83.915 kg (185 lb)] 83.915 kg (185 lb) (02/19 1453) Hemoglobin & Hematocrit     Component Value Date/Time   HGB 12.5 01/11/2012 0506   HCT 37.7 01/11/2012 0506    Assessment: s/p Procedure(s) (LRB): HYSTERECTOMY SUPRACERVICAL ABDOMINAL (N/A): stable, progressing well and tolerating diet  Plan: Encourage ambulation Discontinue IV fluids home in am.  LOS: 1 day    Euclide Granito V 01/11/2012, 9:18 AM

## 2012-01-11 NOTE — Addendum Note (Signed)
Addendum  created 01/11/12 1218 by Martavion Couper J Elijha Dedman, CRNA   Modules edited:Notes Section    

## 2012-01-11 NOTE — Anesthesia Postprocedure Evaluation (Signed)
  Anesthesia Post-op Note  Patient: Kim Richardson  Procedure(s) Performed: Procedure(s) (LRB): HYSTERECTOMY SUPRACERVICAL ABDOMINAL (N/A)  Patient Location: room 331  Anesthesia Type: General  Level of Consciousness: awake, alert , oriented and patient cooperative  Airway and Oxygen Therapy: Patient Spontanous Breathing  Post-op Pain: none  Post-op Assessment: Post-op Vital signs reviewed, Patient's Cardiovascular Status Stable, Respiratory Function Stable, Patent Airway, No signs of Nausea or vomiting, Adequate PO intake and Pain level controlled  Post-op Vital Signs: Reviewed and stable  Complications: No apparent anesthesia complications

## 2012-01-12 MED ORDER — OXYCODONE-ACETAMINOPHEN 5-325 MG PO TABS: 1.0000 | ORAL_TABLET | ORAL | Status: AC | PRN

## 2012-01-12 NOTE — Progress Notes (Signed)
Utilization review completed.  

## 2012-01-12 NOTE — Discharge Summary (Signed)
Physician Discharge Summary  Patient ID: Kim Richardson MRN: [id number] DOB/AGE: [birth year] 51 y.o.  Admit date: 01/10/2012 Discharge date: 01/12/2012  Admission Diagnoses: Symptomatic uterine fibroids  Discharge Diagnoses: Same  Active Problems:  * No active hospital problems. *   procedure laparotomy with supracervical hysterectomy removal of left ovary. Tubes bilaterally clinically absent already  Discharged Condition: good  Hospital Course: 51 year old female was admitted for hysterectomy performed through a prior midline vertical incision excision of the old cicatrix. Patient had 150 cc blood loss, had excellent pain management with PCA pump, tolerated regular diet and was stable for discharge on postoperative day 2 in good condition without complications or sequelae.  Consults: None  Significant Diagnostic Studies: labs:  Hemoglobin & Hematocrit     Component Value Date/Time   HGB 12.5 01/11/2012 0506   HCT 37.7 01/11/2012 0506     Treatments: surgery: Abdominal supracervical hysterectomy with left vitrectomy   Discharge Exam: Blood pressure 156/85, pulse 55, temperature 98.1 F (36.7 C), temperature source Oral, resp. rate 20, height 5\' 5"  (1.651 m), weight 83.915 kg (185 lb), SpO2 95.00%. General appearance: alert, cooperative and appears stated age GI: soft, non-tender; bowel sounds normal; no masses,  no organomegaly and Incision clean dressing dry Extremities: Homans sign is negative, no sign of DVT  Disposition: Final discharge disposition not confirmed discharge home with followup 1 week [facility]  Discharge Orders    Future Orders Please Complete By Expires   Diet - low sodium heart healthy      Increase activity slowly      Discharge instructions      Comments:   General Gynecological Post-Operative Instructions You may expect to feel dizzy, weak, and drowsy for as long as 24 hours after receiving the medicine that made you sleep (anesthetic). The  following information pertains to your recovery period for the first 24 hours following surgery.  Do not drive a car, ride a bicycle, participate in physical activities, or take public transportation until you are done taking narcotic pain medicines or as directed by your caregiver.  Do not drink alcohol or take tranquilizers.  Do not take medicine that has not been prescribed by your caregiver.  Do not sign important papers or make important decisions while on narcotic pain medicines.  Have a responsible person with you.  CARE OF INCISION  Keep incision clean and dry. Take showers instead of baths until your caregiver gives you permission to take baths. Check with your caregiver if you have tubes coming from the wound site.  Avoid heavy lifting (more than 10 pounds/4.5 kilograms), pushing, or pulling.  Avoid activities that may risk injury to your surgical site.  Only take over-the-counter or prescription medicines for pain, discomfort, or fever as directed by your caregiver. Do not take aspirin. It can make you bleed. Take medicines (antibiotics) that kill germs as directed.  Call the office or go to the MAU if:  You feel sick to your stomach (nauseous).  You start to throw up (vomit).  You have trouble eating or drinking.  You have an oral temperature above 100.4.  You have constipation that is not helped by adjusting diet or increasing fluid intake. Pain medicines are a common cause of constipation.  SEEK IMMEDIATE MEDICAL CARE IF:  You have persistent dizziness.  You have difficulty breathing or a congested sounding (croupy) cough.  You have an oral temperature above 102.5, not controlled by medicine.  There is increasing pain or tenderness near or  in the surgical site.  ExitCare Patient Information 2011 [place], [place].   Remove dressing in 24 hours      Call MD for:  temperature >100.4        Medication List  As of 01/12/2012  1:05 PM   TAKE these medications         irbesartan  300 MG tablet   Commonly known as: AVAPRO   Take 300 mg by mouth at bedtime.      oxyCODONE-acetaminophen 5-325 MG per tablet   Commonly known as: PERCOCET   Take 1-2 tablets by mouth every 4 (four) hours as needed.           Follow-up Information    Follow up in 1 week. (or earlier as needed if symptoms worsen)          SignedTilda Burrow 01/12/2012, 1:05 PM

## 2012-01-13 ENCOUNTER — Encounter (HOSPITAL_COMMUNITY): Payer: Self-pay | Admitting: Obstetrics and Gynecology

## 2013-08-23 ENCOUNTER — Encounter (HOSPITAL_COMMUNITY): Payer: Self-pay | Admitting: *Deleted

## 2013-08-23 ENCOUNTER — Emergency Department (HOSPITAL_COMMUNITY)
Admission: EM | Admit: 2013-08-23 | Discharge: 2013-08-23 | Disposition: A | Discharge status: Home / Self Care | Payer: Self-pay | Attending: Emergency Medicine | Admitting: Emergency Medicine

## 2013-08-23 DIAGNOSIS — Z90710 Acquired absence of both cervix and uterus: Secondary | ICD-10-CM | POA: Insufficient documentation

## 2013-08-23 DIAGNOSIS — R109 Unspecified abdominal pain: Secondary | ICD-10-CM

## 2013-08-23 DIAGNOSIS — Z79899 Other long term (current) drug therapy: Secondary | ICD-10-CM | POA: Insufficient documentation

## 2013-08-23 DIAGNOSIS — I10 Essential (primary) hypertension: Secondary | ICD-10-CM | POA: Insufficient documentation

## 2013-08-23 DIAGNOSIS — N390 Urinary tract infection, site not specified: Secondary | ICD-10-CM | POA: Insufficient documentation

## 2013-08-23 DIAGNOSIS — Z8619 Personal history of other infectious and parasitic diseases: Secondary | ICD-10-CM | POA: Insufficient documentation

## 2013-08-23 DIAGNOSIS — Z8669 Personal history of other diseases of the nervous system and sense organs: Secondary | ICD-10-CM | POA: Insufficient documentation

## 2013-08-23 DIAGNOSIS — F17200 Nicotine dependence, unspecified, uncomplicated: Secondary | ICD-10-CM | POA: Insufficient documentation

## 2013-08-23 DIAGNOSIS — Z8742 Personal history of other diseases of the female genital tract: Secondary | ICD-10-CM | POA: Insufficient documentation

## 2013-08-23 LAB — URINALYSIS, ROUTINE W REFLEX MICROSCOPIC
Hgb urine dipstick: NEGATIVE
Protein, ur: NEGATIVE mg/dL
Urobilinogen, UA: 0.2 mg/dL (ref 0.0–1.0)

## 2013-08-23 LAB — COMPREHENSIVE METABOLIC PANEL
ALT: 23 U/L (ref 0–35)
Alkaline Phosphatase: 68 U/L (ref 39–117)
BUN: 11 mg/dL (ref 6–23)
CO2: 28 mEq/L (ref 19–32)
GFR calc Af Amer: 77 mL/min — ABNORMAL LOW (ref >90)
GFR calc non Af Amer: 66 mL/min — ABNORMAL LOW (ref >90)
Glucose, Bld: 112 mg/dL — ABNORMAL HIGH (ref 70–99)
Potassium: 4 mEq/L (ref 3.5–5.1)
Sodium: 140 mEq/L (ref 135–145)
Total Bilirubin: 0.4 mg/dL (ref 0.3–1.2)

## 2013-08-23 LAB — CBC WITH DIFFERENTIAL/PLATELET
Basophils Absolute: 0 10*3/uL (ref 0.0–0.1)
Basophils Relative: 1 % (ref 0–1)
MCHC: 34.2 g/dL (ref 30.0–36.0)
Monocytes Absolute: 0.6 10*3/uL (ref 0.1–1.0)
Neutro Abs: 3.7 10*3/uL (ref 1.7–7.7)
Neutrophils Relative %: 56 % (ref 43–77)
Platelets: 245 10*3/uL (ref 150–400)
RDW: 12.4 % (ref 11.5–15.5)

## 2013-08-23 LAB — LIPASE, BLOOD: Lipase: 21 U/L (ref 11–59)

## 2013-08-23 LAB — URINE MICROSCOPIC-ADD ON

## 2013-08-23 MED ORDER — CEPHALEXIN 500 MG PO CAPS: 500.0000 mg | ORAL_CAPSULE | Freq: Four times a day (QID) | ORAL | Status: DC

## 2013-08-23 NOTE — ED Provider Notes (Signed)
CSN: [id number]     Arrival date & time 08/23/13  1239 History  This chart was scribed for Joya Gaskins, MD by Bennett Scrape, ED Scribe. This patient was seen in room APA03/[facility] and the patient's care was started at 1:03 PM.   Chief Complaint  Patient presents with  . Abdominal Pain    Patient is a 52 y.o. female presenting with abdominal pain. The history is provided by the patient. No language interpreter was used.  Abdominal Pain Pain location:  RUQ Pain radiates to:  RLQ and suprapubic region Pain severity:  Mild Onset quality:  Gradual Duration:  8 weeks Timing:  Constant Progression:  Unchanged Chronicity:  New Context: not sick contacts   Relieved by:  Nothing Worsened by:  Movement and eating Ineffective treatments:  None tried Associated symptoms: nausea   Associated symptoms: no chills, no dysuria, no fever and no vomiting     HPI Comments: Kim Richardson is a 52 y.o. female who presents to the Emergency Department complaining of right flank pain/ RUQ that has been persistent for the past 2 months. The pain radiates into the RLQ and suprapubic region and is aggravated intermittently by eating and standing. She states that she was seen at the [facility] Department and was told to come to the ED for further evaluation. She reports nausea yesterday but denies any emesis, CP and urinary symptoms as associated symptoms.  Denies any recent rectal bleeding and denies black stools  Past Medical History  Diagnosis Date  . Trichomonas 2012    Rx'd flagyl 09/2011  . Hypertension     noncompliant 09/2011 due to "no money"  . Cataract of both eyes   . Uterine fibroid   . PONV (postoperative nausea and vomiting)    Past Surgical History  Procedure Laterality Date  . Cesarean section  [address] [place]  . Tubal ligation      at last cesarean  . Supracervical abdominal hysterectomy  01/10/2012    Procedure: HYSTERECTOMY SUPRACERVICAL ABDOMINAL;  Surgeon:  Tilda Burrow, MD;  Location: [facility];  Service: Gynecology;  Laterality: N/A;  total abdominal hysterectomy with left oophorectomy   Family History  Problem Relation Age of Onset  . Anesthesia problems Neg Hx   . Hypotension Neg Hx   . Malignant hyperthermia Neg Hx   . Pseudochol deficiency Neg Hx   . Hypertension Mother   . Heart failure Mother    History  Substance Use Topics  . Smoking status: Current Every Day Smoker -- 1.00 packs/day for 39 years    Types: Cigarettes  . Smokeless tobacco: Not on file  . Alcohol Use: 21.0 oz/week    35 Cans of beer per week   No OB history provided.  Review of Systems  Constitutional: Negative for fever and chills.  Gastrointestinal: Positive for nausea and abdominal pain. Negative for vomiting.  Genitourinary: Negative for dysuria.  All other systems reviewed and are negative.    Allergies  Review of patient's allergies indicates no known allergies.  Home Medications   Current Outpatient Rx  Name  Route  Sig  Dispense  Refill  . valsartan (DIOVAN) 80 MG tablet   Oral   Take 80 mg by mouth daily.          Triage Vitals: BP 157/94  Pulse 72  Temp(Src) 98.4 F (36.9 C) (Oral)  Resp 20  Ht 5' 5.5" (1.664 m)  Wt 173 lb (78.472 kg)  BMI 28.34 kg/m2  SpO2 99%  Physical Exam  Nursing note and vitals reviewed.  CONSTITUTIONAL: Well developed/well nourished HEAD: Normocephalic/atraumatic EYES: EOMI/PERRL ENMT: Mucous membranes moist NECK: supple no meningeal signs SPINE:entire spine nontender CV: S1/S2 noted, no murmurs/rubs/gallops noted LUNGS: Lungs are clear to auscultation bilaterally, no apparent distress ABDOMEN: soft, mild tenderness to RUQ and RLQ, no rebound or guarding GU:no cva tenderness NEURO: Pt is awake/alert, moves all extremitiesx4 EXTREMITIES: pulses normal, full ROM SKIN: warm, color normal PSYCH: no abnormalities of mood noted  ED Course  Procedures   DIAGNOSTIC STUDIES: Oxygen Saturation is  99% on room air, normal by my interpretation.    COORDINATION OF CARE: 1:06 PM-Discussed treatment plan which includes CBC panel, CMP and UA with pt at bedside and pt agreed to plan. Pt declined pain medication.  2:48 PM Pt well appearing, no distress.  She admits symptoms have been present for months.  She has no signs of acute surgical abdomen.  She reports her PCP sent her for evaluation of an ulcer.  She denies any recent rectal bleeding and she is not anemic.  U/a c/w possible uti, will treat with keflex Pt is otherwise stable and well appearing for d/c home.  I doubt acute abdominal process at this time Pt feels comfortable for d/c home  MDM  No diagnosis found. Labs/vital reviewed and considered Nursing notes including past medical history and social history reviewed and considered in documentation      Date: 08/23/2013  Rate: 63  Rhythm: normal sinus rhythm  QRS Axis: normal  Intervals: normal  ST/T Wave abnormalities: inverted t waves laterally  Conduction Disutrbances:none  Narrative Interpretation:   Old EKG Reviewed: unchanged from 01/05/12     I personally performed the services described in this documentation, which was scribed in my presence. The recorded information has been reviewed and is accurate.      Joya Gaskins, MD 08/23/13 1450

## 2013-08-23 NOTE — ED Notes (Signed)
RLQ pain, comes from rt lower back , around to front , sent from [facility].  Nausea, no vomiting.   No urinary sx.  No rash.  No fever or chills

## 2013-08-26 LAB — URINE CULTURE: Colony Count: >=100000

## 2016-10-19 ENCOUNTER — Other Ambulatory Visit (HOSPITAL_COMMUNITY): Payer: Self-pay | Admitting: Family Medicine

## 2016-10-19 DIAGNOSIS — G8929 Other chronic pain: Secondary | ICD-10-CM

## 2016-10-19 DIAGNOSIS — R102 Pelvic and perineal pain: Principal | ICD-10-CM

## 2016-10-20 ENCOUNTER — Other Ambulatory Visit (HOSPITAL_COMMUNITY): Payer: Self-pay | Admitting: Family Medicine

## 2016-10-20 DIAGNOSIS — R102 Pelvic and perineal pain: Principal | ICD-10-CM

## 2016-10-20 DIAGNOSIS — G8929 Other chronic pain: Secondary | ICD-10-CM

## 2017-07-16 ENCOUNTER — Emergency Department (HOSPITAL_COMMUNITY): Payer: Medicaid Other

## 2017-07-16 ENCOUNTER — Encounter (HOSPITAL_COMMUNITY): Payer: Self-pay | Admitting: Emergency Medicine

## 2017-07-16 ENCOUNTER — Emergency Department (HOSPITAL_COMMUNITY)
Admission: EM | Admit: 2017-07-16 | Discharge: 2017-07-16 | Disposition: A | Discharge status: Home / Self Care | Payer: Medicaid Other | Attending: Emergency Medicine | Admitting: Emergency Medicine

## 2017-07-16 DIAGNOSIS — F17210 Nicotine dependence, cigarettes, uncomplicated: Secondary | ICD-10-CM | POA: Diagnosis not present

## 2017-07-16 DIAGNOSIS — R1010 Upper abdominal pain, unspecified: Secondary | ICD-10-CM | POA: Diagnosis present

## 2017-07-16 DIAGNOSIS — I10 Essential (primary) hypertension: Secondary | ICD-10-CM | POA: Insufficient documentation

## 2017-07-16 LAB — CBC
HEMATOCRIT: 41.5 % (ref 36.0–46.0)
Hemoglobin: 14.3 g/dL (ref 12.0–15.0)
MCH: 31.8 pg (ref 26.0–34.0)
MCHC: 34.5 g/dL (ref 30.0–36.0)
MCV: 92.4 fL (ref 78.0–100.0)
Platelets: 237 10*3/uL (ref 150–400)
RBC: 4.49 MIL/uL (ref 3.87–5.11)
RDW: 13 % (ref 11.5–15.5)
WBC: 6.6 10*3/uL (ref 4.0–10.5)

## 2017-07-16 LAB — URINALYSIS, ROUTINE W REFLEX MICROSCOPIC
Bilirubin Urine: NEGATIVE
Glucose, UA: NEGATIVE mg/dL
Ketones, ur: NEGATIVE mg/dL
NITRITE: NEGATIVE
PROTEIN: 30 mg/dL — AB
Specific Gravity, Urine: 1.029 (ref 1.005–1.030)
pH: 5 (ref 5.0–8.0)

## 2017-07-16 LAB — COMPREHENSIVE METABOLIC PANEL
ALBUMIN: 4.4 g/dL (ref 3.5–5.0)
ALT: 19 U/L (ref 14–54)
AST: 21 U/L (ref 15–41)
Alkaline Phosphatase: 60 U/L (ref 38–126)
Anion gap: 8 (ref 5–15)
BILIRUBIN TOTAL: 0.6 mg/dL (ref 0.3–1.2)
BUN: 12 mg/dL (ref 6–20)
CO2: 28 mmol/L (ref 22–32)
Calcium: 9.8 mg/dL (ref 8.9–10.3)
Chloride: 107 mmol/L (ref 101–111)
Creatinine, Ser: 0.89 mg/dL (ref 0.44–1.00)
GFR calc Af Amer: >60 mL/min (ref >60)
GFR calc non Af Amer: >60 mL/min (ref >60)
GLUCOSE: 99 mg/dL (ref 65–99)
Potassium: 4.1 mmol/L (ref 3.5–5.1)
Sodium: 143 mmol/L (ref 135–145)
Total Protein: 7.9 g/dL (ref 6.5–8.1)

## 2017-07-16 LAB — LIPASE, BLOOD: Lipase: 24 U/L (ref 11–51)

## 2017-07-16 MED ORDER — RANITIDINE HCL 150 MG PO TABS: 150.0000 mg | ORAL_TABLET | Freq: Two times a day (BID) | ORAL | 0 refills | Status: DC

## 2017-07-16 MED ORDER — IOPAMIDOL (ISOVUE-300) INJECTION 61%
100.0000 mL | Freq: Once | INTRAVENOUS | Status: AC | PRN
  Administered 2017-07-16: 100 mL via INTRAVENOUS

## 2017-07-16 MED ORDER — TRAMADOL HCL 50 MG PO TABS: 50.0000 mg | ORAL_TABLET | Freq: Four times a day (QID) | ORAL | 0 refills | Status: DC | PRN

## 2017-07-16 NOTE — ED Notes (Signed)
To CT via stretcher

## 2017-07-16 NOTE — Discharge Instructions (Signed)
Follow-up with Dr. Oneida Alar for your family doctor for recheck in 2 weeks

## 2017-07-16 NOTE — ED Notes (Signed)
Awaiting CT results.

## 2017-07-16 NOTE — ED Notes (Signed)
Pt reports R rib pain for 4 months- has taken no OTC meds for pain States is worse with movement,  Seen by [facility] this week who did a UA

## 2017-07-16 NOTE — ED Triage Notes (Addendum)
Pt reports she has had right sided lower abdominal pain that radiates to back Pain is occasionally on left side. This has been occurring for 4 months. No known injury. Pain increases with ambulation

## 2017-07-20 NOTE — ED Provider Notes (Signed)
[facility] DEPT Provider Note   CSN: [id number] Arrival date & time: 07/16/17  1234     History   Chief Complaint Chief Complaint  Patient presents with  . Abdominal Pain    HPI Kim Richardson is a 56 y.o. female.  Patient complains of upper abdominal pain for number months   The history is provided by the patient.  Abdominal Pain   This is a new problem. The current episode started more than 1 week ago. The problem occurs every several days. The problem has not changed since onset.The pain is associated with an unknown factor. The pain is located in the epigastric region. The quality of the pain is aching. Pertinent negatives include diarrhea, frequency, hematuria and headaches.    Past Medical History:  Diagnosis Date  . Cataract of both eyes   . Hypertension    noncompliant 09/2011 due to "no money"  . PONV (postoperative nausea and vomiting)   . Trichomonas 2012   Rx'd flagyl 09/2011  . Uterine fibroid     There are no active problems to display for this patient.   Past Surgical History:  Procedure Laterality Date  . [place], [place]  . SUPRACERVICAL ABDOMINAL HYSTERECTOMY  01/10/2012   Procedure: HYSTERECTOMY SUPRACERVICAL ABDOMINAL;  Surgeon: Jonnie Kind, MD;  Location: [facility];  Service: Gynecology;  Laterality: N/A;  total abdominal hysterectomy with left oophorectomy  . TUBAL LIGATION     at last cesarean    OB History    No data available       Home Medications    Prior to Admission medications   Medication Sig Start Date End Date Taking? Authorizing Provider  ranitidine (ZANTAC) 150 MG tablet Take 1 tablet (150 mg total) by mouth 2 (two) times daily. 07/16/17   Milton Ferguson, MD  traMADol (ULTRAM) 50 MG tablet Take 1 tablet (50 mg total) by mouth every 6 (six) hours as needed. 07/16/17   Milton Ferguson, MD    Family History Family History  Problem Relation Age of Onset  . Hypertension Mother   . Heart  failure Mother   . Anesthesia problems Neg Hx   . Hypotension Neg Hx   . Malignant hyperthermia Neg Hx   . Pseudochol deficiency Neg Hx     Social History Social History  Substance Use Topics  . Smoking status: Current Every Day Smoker    Packs/day: 0.50    Years: 39.00    Types: Cigarettes  . Smokeless tobacco: Not on file  . Alcohol use 21.0 oz/week    35 Cans of beer per week     Allergies   Patient has no known allergies.   Review of Systems Review of Systems  Constitutional: Negative for appetite change and fatigue.  HENT: Negative for congestion, ear discharge and sinus pressure.   Eyes: Negative for discharge.  Respiratory: Negative for cough.   Cardiovascular: Negative for chest pain.  Gastrointestinal: Positive for abdominal pain. Negative for diarrhea.  Genitourinary: Negative for frequency and hematuria.  Musculoskeletal: Negative for back pain.  Skin: Negative for rash.  Neurological: Negative for seizures and headaches.  Psychiatric/Behavioral: Negative for hallucinations.     Physical Exam Updated Vital Signs BP (!) 167/90   Pulse 63   Temp 98.4 F (36.9 C) (Oral)   Resp 20   Ht 5\' 4"  (1.626 m)   Wt 77.1 kg (170 lb)   SpO2 98%   BMI 29.18 kg/m  Physical Exam  Constitutional: She is oriented to person, place, and time. She appears well-developed.  HENT:  Head: Normocephalic.  Eyes: Conjunctivae and EOM are normal. No scleral icterus.  Neck: Neck supple. No thyromegaly present.  Cardiovascular: Normal rate and regular rhythm.  Exam reveals no gallop and no friction rub.   No murmur heard. Pulmonary/Chest: No stridor. She has no wheezes. She has no rales. She exhibits no tenderness.  Abdominal: She exhibits no distension. There is tenderness. There is no rebound.  Musculoskeletal: Normal range of motion. She exhibits no edema.  Lymphadenopathy:    She has no cervical adenopathy.  Neurological: She is oriented to person, place, and time.  She exhibits normal muscle tone. Coordination normal.  Skin: No rash noted. No erythema.  Psychiatric: She has a normal mood and affect. Her behavior is normal.     ED Treatments / Results  Labs (all labs ordered are listed, but only abnormal results are displayed) Labs Reviewed  URINALYSIS, ROUTINE W REFLEX MICROSCOPIC - Abnormal; Notable for the following:       Result Value   Color, Urine AMBER (*)    APPearance CLOUDY (*)    Hgb urine dipstick SMALL (*)    Protein, ur 30 (*)    Leukocytes, UA TRACE (*)    Bacteria, UA FEW (*)    Squamous Epithelial / LPF 6-30 (*)    All other components within normal limits  LIPASE, BLOOD  COMPREHENSIVE METABOLIC PANEL  CBC    EKG  EKG Interpretation None       Radiology No results found.  Procedures Procedures (including critical care time)  Medications Ordered in ED Medications  iopamidol (ISOVUE-300) 61 % injection 100 mL (100 mLs Intravenous Contrast Given 07/16/17 1435)     Initial Impression / Assessment and Plan / ED Course  I have reviewed the triage vital signs and the nursing notes.  Pertinent labs & imaging results that were available during my care of the patient were reviewed by me and considered in my medical decision making (see chart for details).     CT scan negative. Suspect peptic ulcer problems. Patient will be treated with Zantac and Ultram will follow-up with a family doctor or a GI doctor  Final Clinical Impressions(s) / ED Diagnoses   Final diagnoses:  Pain of upper abdomen    New Prescriptions Discharge Medication List as of 07/16/2017  3:44 PM    START taking these medications   Details  ranitidine (ZANTAC) 150 MG tablet Take 1 tablet (150 mg total) by mouth 2 (two) times daily., Starting Sun 07/16/2017, Print    traMADol (ULTRAM) 50 MG tablet Take 1 tablet (50 mg total) by mouth every 6 (six) hours as needed., Starting Sun 07/16/2017, Print         Milton Ferguson, MD 07/20/17 1306

## 2018-08-09 IMAGING — CT CT ABD-PELV W/ CM
2 of 4 series · 16 of 46 positions shown, 18 images · IV contrast (Isovue)
Comparison: None.

CLINICAL DATA: Right rib pain x4 months

EXAM:
CT ABDOMEN AND PELVIS WITH CONTRAST
TECHNIQUE: Multidetector CT imaging of the abdomen and pelvis was performed
using the standard protocol following bolus administration of
intravenous contrast.
CONTRAST:  100mL [id number] IOPAMIDOL ([id number]) INJECTION 61%

[Series 2: axial st · axial · 0.83mm/px · z∈[-97,+313]mm · 13 of 92 slices shown, 15 images]
[im 5/92  soft-tissue]
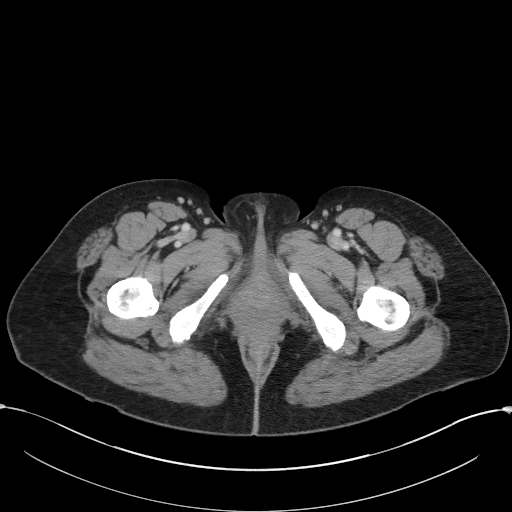
[im 5/92  bone]
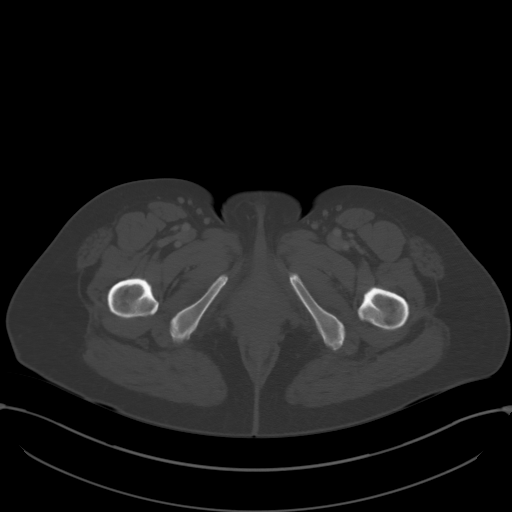
[im 13/92  soft-tissue]
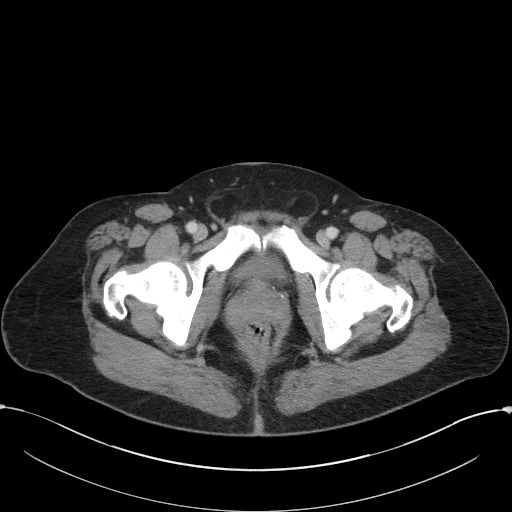
[im 21/92  soft-tissue]
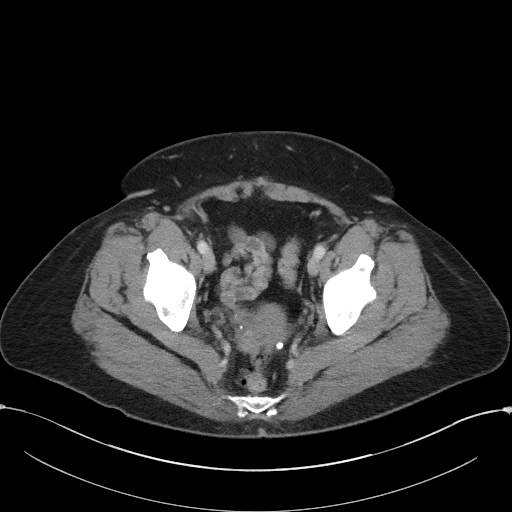
[im 25/92  soft-tissue]
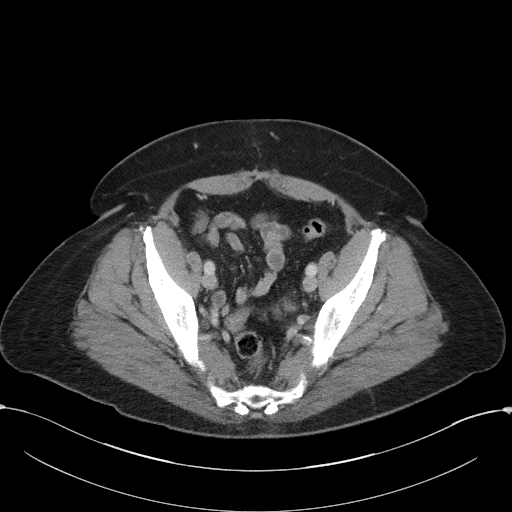
[im 34/92  soft-tissue]
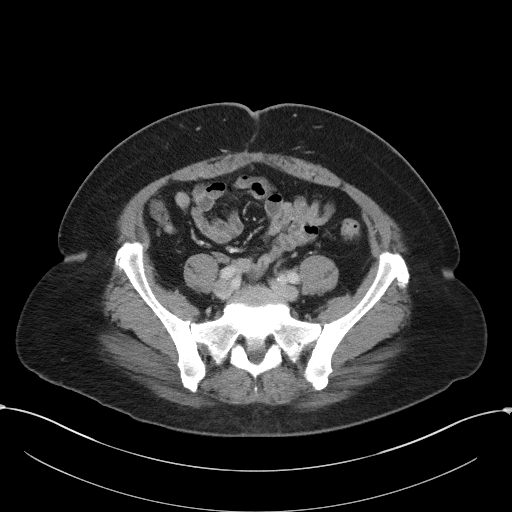
[im 38/92  soft-tissue]
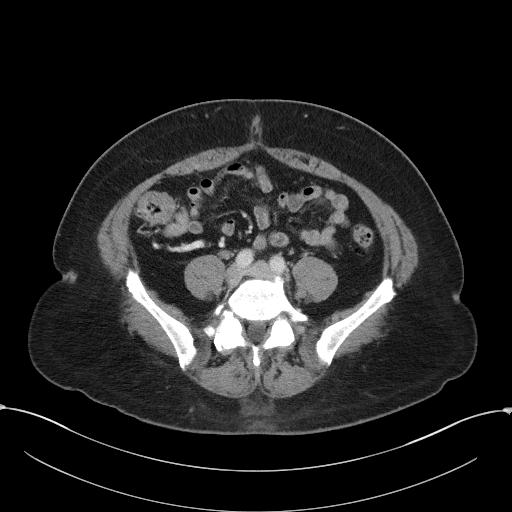
[im 46/92  soft-tissue]
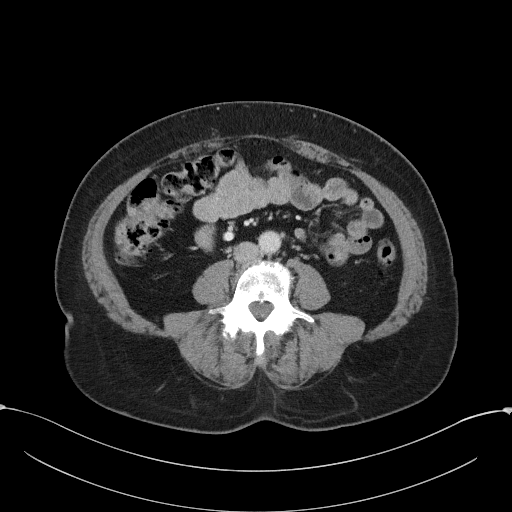
[im 54/92  soft-tissue]
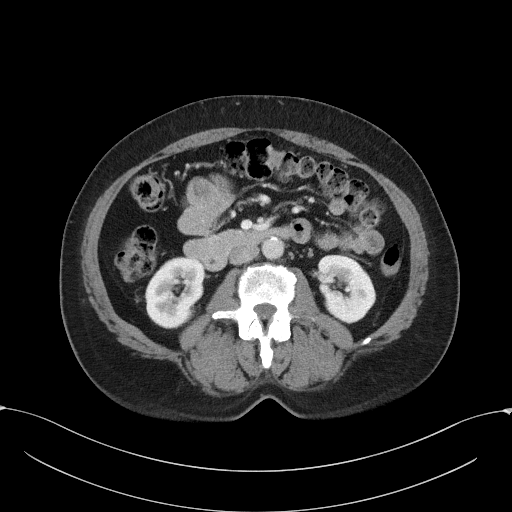
[im 58/92  soft-tissue]
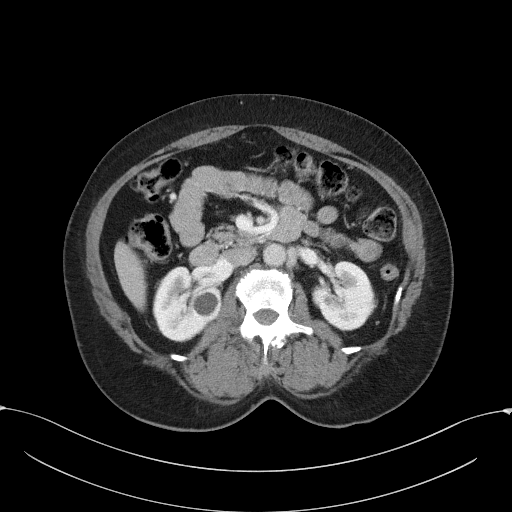
[im 58/92  bone]
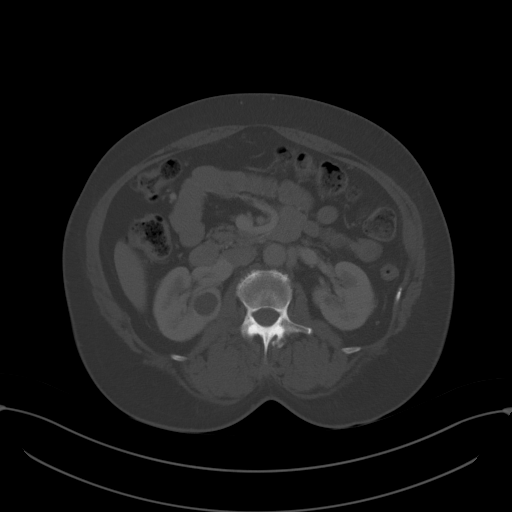
[im 67/92  soft-tissue]
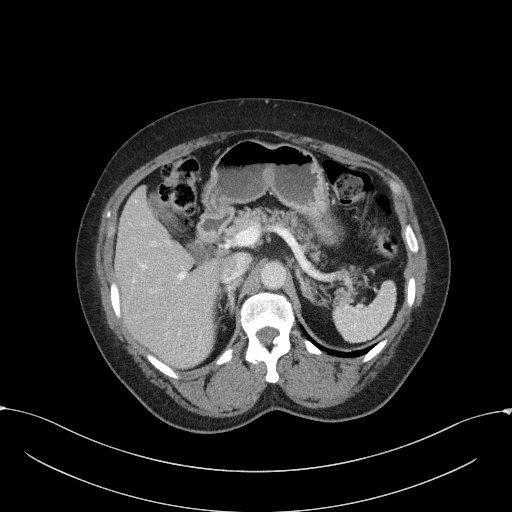
[im 71/92  soft-tissue]
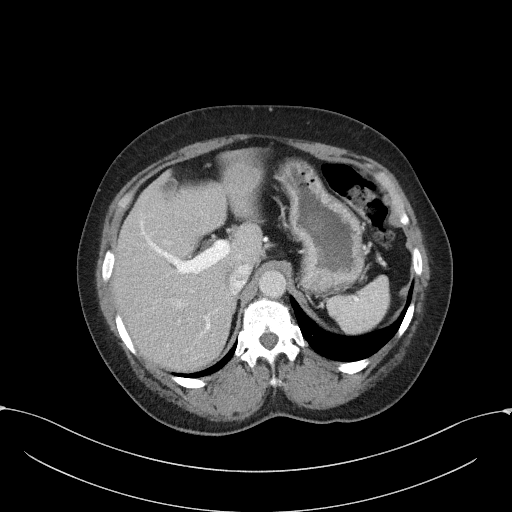
[im 79/92  soft-tissue]
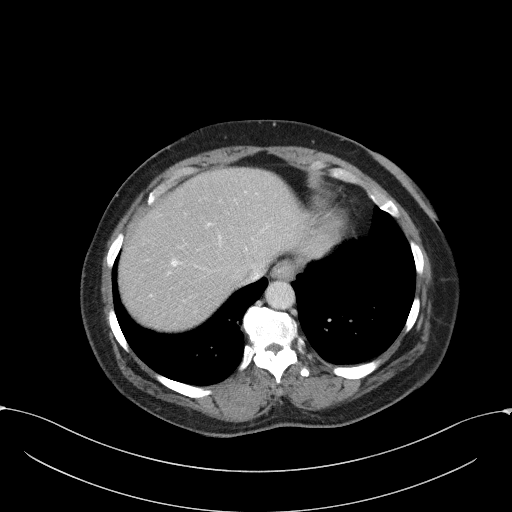
[im 87/92  soft-tissue]
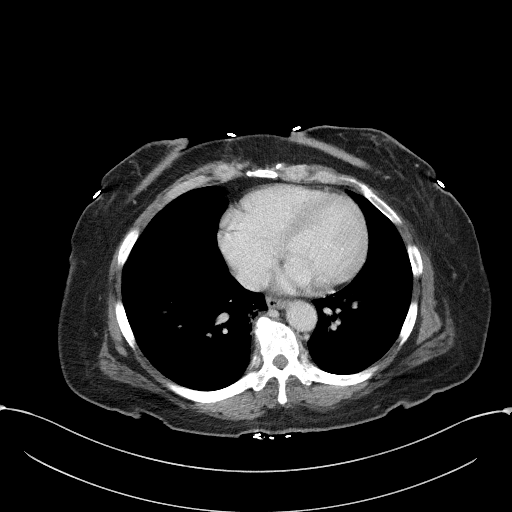

[Series 4: coronal st · coronal · 0.72mm/px · 3 of 103 slices shown]
[im 35/103  soft-tissue]
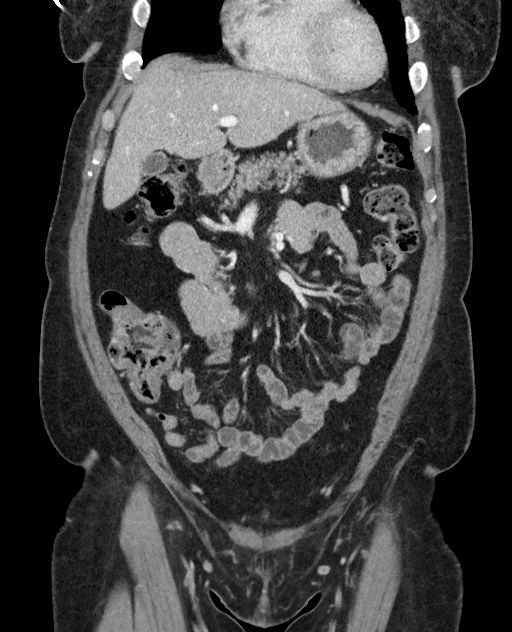
[im 46/103  soft-tissue]
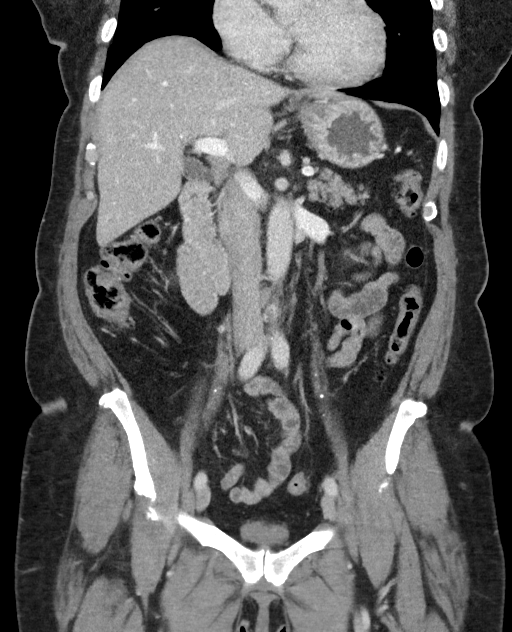
[im 57/103  soft-tissue]
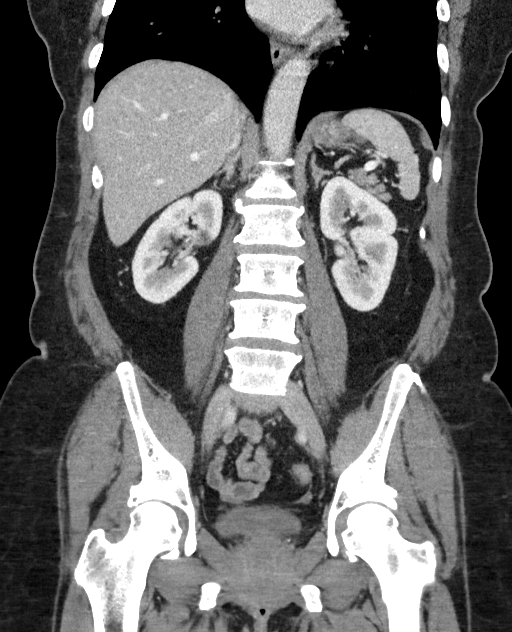

[16 of 46 positions shown; findings below may reference images not displayed]

FINDINGS: Lower chest: Lung bases are clear.

Hepatobiliary: Liver is within normal limits.

Gallbladder is unremarkable. No intrahepatic or extrahepatic ductal
dilatation.

Pancreas: Within normal limits.

Spleen: Within normal limits.

Adrenals/Urinary Tract: Adrenal glands are within normal limits.

Bilateral renal cysts, measuring up to 2.1 cm in the medial
interpolar right kidney (series 6/ image 20). No hydronephrosis.

Bladder is underdistended but unremarkable.

Stomach/Bowel: Stomach is within normal limits.

No evidence of bowel obstruction.

Normal appendix (series 2/ image 61).

Vascular/Lymphatic: No evidence of abdominal aortic aneurysm.

Mild atherosclerotic calcifications of the abdominal aorta.

No suspicious abdominopelvic lymphadenopathy.

Reproductive: Status post hysterectomy.

Right ovary is within normal limits.  No left adnexal mass.

Other: No abdominopelvic ascites.

Small fat containing bilateral inguinal hernias (series 2/ image
81).

Musculoskeletal: Degenerative changes of the visualized
thoracolumbar spine. Visualized right ribs are within normal limits.
IMPRESSION: No CT findings to account for the patient's right rib/abdominal
pain.

No evidence of bowel obstruction.  Normal appendix.

Additional ancillary findings as above.

## 2019-07-12 ENCOUNTER — Emergency Department (HOSPITAL_COMMUNITY)
Admission: EM | Admit: 2019-07-12 | Discharge: 2019-07-12 | Disposition: A | Discharge status: Home / Self Care | Payer: Medicaid Other | Attending: Emergency Medicine | Admitting: Emergency Medicine

## 2019-07-12 ENCOUNTER — Other Ambulatory Visit: Payer: Self-pay

## 2019-07-12 ENCOUNTER — Encounter (HOSPITAL_COMMUNITY): Payer: Self-pay | Admitting: Emergency Medicine

## 2019-07-12 DIAGNOSIS — R202 Paresthesia of skin: Secondary | ICD-10-CM | POA: Diagnosis present

## 2019-07-12 DIAGNOSIS — Z5321 Procedure and treatment not carried out due to patient leaving prior to being seen by health care provider: Secondary | ICD-10-CM | POA: Insufficient documentation

## 2019-07-12 LAB — URINALYSIS, ROUTINE W REFLEX MICROSCOPIC
Bilirubin Urine: NEGATIVE
Glucose, UA: NEGATIVE mg/dL
Hgb urine dipstick: NEGATIVE
Ketones, ur: NEGATIVE mg/dL
Nitrite: NEGATIVE
Protein, ur: NEGATIVE mg/dL
Specific Gravity, Urine: 1.019 (ref 1.005–1.030)
pH: 5 (ref 5.0–8.0)

## 2019-07-12 NOTE — ED Triage Notes (Signed)
Pt c/o worsening RT sided numbness x 1 year. States sometimes it feels like she is being poked by needles in her feet and legs. Pt also c/o RT sided flank pain 4-5 months. Denies urinary issues.

## 2022-07-08 DIAGNOSIS — F17201 Nicotine dependence, unspecified, in remission: Secondary | ICD-10-CM | POA: Insufficient documentation

## 2022-07-08 DIAGNOSIS — I10 Essential (primary) hypertension: Secondary | ICD-10-CM | POA: Insufficient documentation

## 2022-07-08 DIAGNOSIS — E119 Type 2 diabetes mellitus without complications: Secondary | ICD-10-CM | POA: Insufficient documentation

## 2022-07-09 DIAGNOSIS — I2510 Atherosclerotic heart disease of native coronary artery without angina pectoris: Secondary | ICD-10-CM | POA: Insufficient documentation

## 2022-10-21 DIAGNOSIS — E782 Mixed hyperlipidemia: Secondary | ICD-10-CM | POA: Insufficient documentation

## 2023-11-27 ENCOUNTER — Emergency Department (HOSPITAL_COMMUNITY)
Admission: EM | Admit: 2023-11-27 | Discharge: 2023-11-27 | Disposition: A | Discharge status: Home / Self Care | Payer: Medicaid Other | Attending: Student | Admitting: Student

## 2023-11-27 ENCOUNTER — Emergency Department (HOSPITAL_COMMUNITY): Payer: Medicaid Other

## 2023-11-27 DIAGNOSIS — Z7982 Long term (current) use of aspirin: Secondary | ICD-10-CM | POA: Diagnosis not present

## 2023-11-27 DIAGNOSIS — I10 Essential (primary) hypertension: Secondary | ICD-10-CM | POA: Diagnosis not present

## 2023-11-27 DIAGNOSIS — Z79899 Other long term (current) drug therapy: Secondary | ICD-10-CM | POA: Diagnosis not present

## 2023-11-27 DIAGNOSIS — K429 Umbilical hernia without obstruction or gangrene: Secondary | ICD-10-CM | POA: Insufficient documentation

## 2023-11-27 DIAGNOSIS — K4020 Bilateral inguinal hernia, without obstruction or gangrene, not specified as recurrent: Secondary | ICD-10-CM | POA: Insufficient documentation

## 2023-11-27 DIAGNOSIS — A599 Trichomoniasis, unspecified: Secondary | ICD-10-CM | POA: Diagnosis not present

## 2023-11-27 DIAGNOSIS — R109 Unspecified abdominal pain: Secondary | ICD-10-CM

## 2023-11-27 DIAGNOSIS — N281 Cyst of kidney, acquired: Secondary | ICD-10-CM

## 2023-11-27 LAB — CBC
HCT: 40.2 % (ref 36.0–46.0)
Hemoglobin: 13.3 g/dL (ref 12.0–15.0)
MCH: 31.1 pg (ref 26.0–34.0)
MCHC: 33.1 g/dL (ref 30.0–36.0)
MCV: 94.1 fL (ref 80.0–100.0)
Platelets: 205 10*3/uL (ref 150–400)
RBC: 4.27 MIL/uL (ref 3.87–5.11)
RDW: 12.9 % (ref 11.5–15.5)
WBC: 5.8 10*3/uL (ref 4.0–10.5)
nRBC: 0 % (ref 0.0–0.2)

## 2023-11-27 LAB — URINALYSIS, ROUTINE W REFLEX MICROSCOPIC
Bacteria, UA: NONE SEEN
Bilirubin Urine: NEGATIVE
Glucose, UA: NEGATIVE mg/dL
Hgb urine dipstick: NEGATIVE
Ketones, ur: NEGATIVE mg/dL
Nitrite: NEGATIVE
Protein, ur: NEGATIVE mg/dL
Specific Gravity, Urine: 1.025 (ref 1.005–1.030)
pH: 5 (ref 5.0–8.0)

## 2023-11-27 LAB — COMPREHENSIVE METABOLIC PANEL
ALT: 18 U/L (ref 0–44)
AST: 25 U/L (ref 15–41)
Albumin: 3.3 g/dL — ABNORMAL LOW (ref 3.5–5.0)
Alkaline Phosphatase: 60 U/L (ref 38–126)
Anion gap: 6 (ref 5–15)
BUN: 16 mg/dL (ref 8–23)
CO2: 26 mmol/L (ref 22–32)
Calcium: 8.6 mg/dL — ABNORMAL LOW (ref 8.9–10.3)
Chloride: 105 mmol/L (ref 98–111)
Creatinine, Ser: 0.76 mg/dL (ref 0.44–1.00)
GFR, Estimated: >60 mL/min (ref >60)
Glucose, Bld: 151 mg/dL — ABNORMAL HIGH (ref 70–99)
Potassium: 3.1 mmol/L — ABNORMAL LOW (ref 3.5–5.1)
Sodium: 137 mmol/L (ref 135–145)
Total Bilirubin: 0.7 mg/dL (ref 0.0–1.2)
Total Protein: 7.1 g/dL (ref 6.5–8.1)

## 2023-11-27 LAB — MAGNESIUM: Magnesium: 2.1 mg/dL (ref 1.7–2.4)

## 2023-11-27 LAB — WET PREP, GENITAL
Clue Cells Wet Prep HPF POC: NONE SEEN
Sperm: NONE SEEN
WBC, Wet Prep HPF POC: <10 (ref <10)
Yeast Wet Prep HPF POC: NONE SEEN

## 2023-11-27 MED ORDER — IOHEXOL 300 MG/ML  SOLN
100.0000 mL | Freq: Once | INTRAMUSCULAR | Status: AC | PRN
  Administered 2023-11-27: 100 mL via INTRAVENOUS

## 2023-11-27 MED ORDER — POTASSIUM CHLORIDE CRYS ER 20 MEQ PO TBCR
40.0000 meq | EXTENDED_RELEASE_TABLET | Freq: Once | ORAL | Status: AC
  Administered 2023-11-27: 40 meq via ORAL
  Filled 2023-11-27: qty 2

## 2023-11-27 MED ORDER — POTASSIUM CHLORIDE CRYS ER 20 MEQ PO TBCR: 20.0000 meq | EXTENDED_RELEASE_TABLET | Freq: Once | ORAL | 0 refills | Status: DC

## 2023-11-27 MED ORDER — ACETAMINOPHEN 325 MG PO TABS
650.0000 mg | ORAL_TABLET | Freq: Once | ORAL | Status: AC
Start: 2023-11-27 — End: 2023-11-27
  Administered 2023-11-27: 650 mg via ORAL
  Filled 2023-11-27: qty 2

## 2023-11-27 MED ORDER — METRONIDAZOLE 500 MG PO TABS: 500.0000 mg | ORAL_TABLET | Freq: Two times a day (BID) | ORAL | 0 refills | Status: DC

## 2023-11-27 MED ORDER — LIDOCAINE 5 % EX PTCH: 1.0000 | MEDICATED_PATCH | CUTANEOUS | 0 refills | Status: DC

## 2023-11-27 MED ORDER — CEPHALEXIN 500 MG PO CAPS: 500.0000 mg | ORAL_CAPSULE | Freq: Four times a day (QID) | ORAL | 0 refills | Status: DC

## 2023-11-27 MED ORDER — LIDOCAINE 5 % EX PTCH
1.0000 | MEDICATED_PATCH | CUTANEOUS | Status: DC
  Administered 2023-11-27: 1 via TRANSDERMAL
  Filled 2023-11-27: qty 1

## 2023-11-27 NOTE — ED Provider Notes (Signed)
 [place] EMERGENCY DEPARTMENT AT [facility] Provider Note   CSN: [id number] Arrival date & time: 11/27/23  9192     History  Chief Complaint  Patient presents with   Back Pain    Kim Richardson is a 63 y.o. female.  HPI   63 year old female presents emergency department with complaints of right flank pain.  Patient states that she has been feeling symptoms intermittently for the past week or so.  Reports pain in the right low back with some radiation to her right lower abdomen.  States that pain is worsened with movement and relieved with rest.  Denies any fever, nausea, vomiting, urinary symptoms, change in bowel habits.  Has tried no medication specifically for her symptoms.  States that when she sits still, does not really feel the pain.  When she moves, notices pain most acutely.  Past medical history significant for trichomonas, hypertension, supracervical abdominal hysterectomy,   Home Medications Prior to Admission medications   Medication Sig Start Date End Date Taking? Authorizing Provider  aspirin  EC 81 MG tablet Take by mouth. 07/12/22  Yes [provider]  atorvastatin  (LIPITOR) 40 MG tablet Take 40 mg by mouth at bedtime. 10/11/23  Yes [provider]  cephALEXin  (KEFLEX ) 500 MG capsule Take 1 capsule (500 mg total) by mouth 4 (four) times daily. 11/27/23  Yes Silver Fell A, PA  lidocaine  (LIDODERM ) 5 % Place 1 patch onto the skin daily. Remove & Discard patch within 12 hours or as directed by MD 11/27/23  Yes Silver Fell A, PA  losartan  (COZAAR ) 50 MG tablet Take 50 mg by mouth daily.   Yes [provider]  metroNIDAZOLE  (FLAGYL ) 500 MG tablet Take 1 tablet (500 mg total) by mouth 2 (two) times daily. 11/27/23  Yes Silver Fell A, PA  potassium chloride  SA (KLOR-CON  M) 20 MEQ tablet Take 1 tablet (20 mEq total) by mouth once for 1 dose. 11/28/23 11/28/23 Yes Silver Fell A, PA  prasugrel (EFFIENT) 10 MG TABS tablet Take 10 mg  by mouth daily.   Yes [provider]  triamcinolone cream (KENALOG) 0.5 % Apply topically. 11/06/23  Yes [provider]      Allergies    Patient has no known allergies.    Review of Systems   Review of Systems  All other systems reviewed and are negative.   Physical Exam Updated Vital Signs BP (!) 143/91 (BP Location: Left Arm)   Pulse 60   Temp 98.4 F (36.9 C) (Oral)   Resp 19   Ht 5' 4 (1.626 m)   Wt 82.6 kg   SpO2 98%   BMI 31.24 kg/m  Physical Exam Vitals and nursing note reviewed.  Constitutional:      General: She is not in acute distress.    Appearance: She is well-developed.  HENT:     Head: Normocephalic and atraumatic.  Eyes:     Conjunctiva/sclera: Conjunctivae normal.  Cardiovascular:     Rate and Rhythm: Normal rate and regular rhythm.  Pulmonary:     Effort: Pulmonary effort is normal. No respiratory distress.     Breath sounds: Normal breath sounds.  Abdominal:     Palpations: Abdomen is soft.     Tenderness: There is abdominal tenderness in the right lower quadrant. There is right CVA tenderness.  Musculoskeletal:        General: No swelling.     Cervical back: Neck supple.  Skin:    General: Skin is  warm and dry.     Capillary Refill: Capillary refill takes less than 2 seconds.  Neurological:     Mental Status: She is alert.  Psychiatric:        Mood and Affect: Mood normal.     ED Results / Procedures / Treatments   Labs (all labs ordered are listed, but only abnormal results are displayed) Labs Reviewed  WET PREP, GENITAL - Abnormal; Notable for the following components:      Result Value   Trich, Wet Prep PRESENT (*)    All other components within normal limits  COMPREHENSIVE METABOLIC PANEL - Abnormal; Notable for the following components:   Potassium 3.1 (*)    Glucose, Bld 151 (*)    Calcium  8.6 (*)    Albumin 3.3 (*)    All other components within normal limits  URINALYSIS, ROUTINE W REFLEX MICROSCOPIC  - Abnormal; Notable for the following components:   APPearance HAZY (*)    Leukocytes,Ua MODERATE (*)    All other components within normal limits  URINE CULTURE  CBC  MAGNESIUM  HIV ANTIBODY (ROUTINE TESTING W REFLEX)  GC/CHLAMYDIA PROBE AMP ([place]) NOT AT [facility]    EKG None  Radiology CT ABDOMEN PELVIS W CONTRAST Result Date: 11/27/2023 CLINICAL DATA:  Right lower quadrant abdominal pain. No acute inflammatory process identified within the abdomen or pelvis. Unremarkable appendix. EXAM: CT ABDOMEN AND PELVIS WITH CONTRAST TECHNIQUE: Multidetector CT imaging of the abdomen and pelvis was performed using the standard protocol following bolus administration of intravenous contrast. RADIATION DOSE REDUCTION: This exam was performed according to the departmental dose-optimization program which includes automated exposure control, adjustment of the mA and/or kV according to patient size and/or use of iterative reconstruction technique. CONTRAST:  OMNIPAQUE  IOHEXOL  300 MG/ML  SOLN COMPARISON:  CT scan abdomen and pelvis from 07/16/2017. FINDINGS: Lower chest: The lung bases are clear. No pleural effusion. The heart is normal in size. No pericardial effusion. Hepatobiliary: The liver is normal in size. Non-cirrhotic configuration. No suspicious mass. No intrahepatic or extrahepatic bile duct dilation. No calcified gallstones. Normal gallbladder wall thickness. No pericholecystic inflammatory changes. Pancreas: Unremarkable. No pancreatic ductal dilatation or surrounding inflammatory changes. Spleen: Within normal limits. No focal lesion. Adrenals/Urinary Tract: Adrenal glands are unremarkable. No suspicious renal mass. There is a 3.4 x 3.7 cm partially exophytic cyst arising from the right kidney upper pole, medially. There is an additional smaller cyst in the left kidney upper pole, medially. No hydronephrosis. No renal or ureteric calculi. Unremarkable urinary bladder. Stomach/Bowel: No  disproportionate dilation of the small or large bowel loops. No evidence of abnormal bowel wall thickening or inflammatory changes. The appendix is unremarkable. There are multiple diverticula mainly in the left hemi colon, without imaging signs of diverticulitis. Vascular/Lymphatic: No ascites or pneumoperitoneum. No abdominal or pelvic lymphadenopathy, by size criteria. No aneurysmal dilation of the major abdominal arteries. There are mild peripheral atherosclerotic vascular calcifications of the aorta and its major branches. Reproductive: Probably status post supracervical hysterectomy. No large adnexal mass seen. Other: There are fat containing umbilical and bilateral inguinal hernias. The soft tissues and abdominal wall are otherwise unremarkable. Musculoskeletal: No suspicious osseous lesions. There are mild multilevel degenerative changes in the visualized spine. IMPRESSION: *No acute inflammatory process identified within the abdomen or pelvis. Unremarkable appendix. *Multiple other nonacute observations, as described above. Electronically Signed   By: Ree Molt M.D.   On: 11/27/2023 13:19    Procedures Procedures    Medications Ordered  in ED Medications  lidocaine  (LIDODERM ) 5 % 1 patch (1 patch Transdermal Patch Applied 11/27/23 0903)  acetaminophen  (TYLENOL ) tablet 650 mg (650 mg Oral Given 11/27/23 0902)  potassium chloride  SA (KLOR-CON  M) CR tablet 40 mEq (40 mEq Oral Given 11/27/23 1041)  iohexol  (OMNIPAQUE ) 300 MG/ML solution 100 mL (100 mLs Intravenous Contrast Given 11/27/23 1201)    ED Course/ Medical Decision Making/ A&P Clinical Course as of 11/27/23 1418  Mon Nov 27, 2023  1201 Was called back into the room given the patient is more sexually active recently and wants to be checked for STDs.  States that she has been having some clear vaginal discharge. [CR]    Clinical Course User Index [CR] Silver Wonda LABOR, [place]                                 Medical Decision  Making Amount and/or Complexity of Data Reviewed Labs: ordered. Radiology: ordered.  Risk OTC drugs. Prescription drug management.   This patient presents to the ED for concern of abdominal pain, this involves an extensive number of treatment options, and is a complaint that carries with it a high risk of complications and morbidity.  The differential diagnosis includes gastritis, PUD, cholecystitis, CBD pathology, diverticulitis, appendicitis, SBO/LBO, volvulus, pyelonephritis, nephrolithiasis, cystitis, other   Co morbidities that complicate the patient evaluation  See HPI   Additional history obtained:  Additional history obtained from EMR External records from outside source obtained and reviewed including hospital records   Lab Tests:  I Ordered, and personally interpreted labs.  The pertinent results include: No leukocytosis.  No evidence of anemia.  Platelets within range.  Mild hypokalemia of 3.1 as well as hyperglycemia of 8.6 otherwise, lites within limits.  No transaminitis.  No renal dysfunction.  UA with contaminated sample with moderate leukocytes, 6-10 RBCs, 6-10 WBCs with no bacteria.  11-20 squamous epithelial cells present.  Wet prep positive for trichomonas.  GC/chlamydia/HIV pending..   Imaging Studies ordered:  I ordered imaging studies including CT abdomen pelvis this I independently visualized and interpreted imaging which showed no acute intra-abdominal abnormality. I agree with the radiologist interpretation   Cardiac Monitoring: / EKG:  The patient was maintained on a cardiac monitor.  I personally viewed and interpreted the cardiac monitored which showed an underlying rhythm of: Sinus rhythm   Consultations Obtained:  N/a   Problem List / ED Course / Critical interventions / Medication management  Right flank pain, trichomonas I ordered medication including Tylenol , potassium chloride , Lidoderm    Reevaluation of the patient after these  medicines showed that the patient improved I have reviewed the patients home medicines and have made adjustments as needed   Social Determinants of Health:  Chronic cigarette use.  Denies, licit drug use.   Test / Admission - Considered:  Right flank pain, trichomonas Vitals signs significant for hypertension blood pressure 143/91. Otherwise within normal range and stable throughout visit. Laboratory/imaging studies significant for: see above 63 year old female presents emergency department with complaints of right-sided flank pain.  Right flank pain intermittent for the past week or so worsened with movement relieved with rest.  On exam, some right lower abdominal tenderness as well as right CVA tenderness.  Labs with slight hypokalemia.  UA contaminated sample but with WBCs, leukocytes but no bacteria seen.  In the absence of urinary symptoms, will refrain from empiric treatment.  CT imaging negative for any acute abnormality.  Suspect patient's pain more associated with musculoskeletal etiology.  Will culture urine though for assessment of potential bacterial pathogen.  Will recommend symptomatic therapy regarding symptoms as described in AVS.  Will supplement patient's potassium in the next few days.  Will also treat patient's trichomonas infectio as her wet prep was positive.  Will recommend close follow-up with primary care for reevaluation of symptoms.  Treatment plan discussed at length with patient and she acknowledged understanding was agreeable to said plan.  Patient overall well-appearing, afebrile in no acute distress. Worrisome signs and symptoms were discussed with the patient, and the patient acknowledged understanding to return to the ED if noticed. Patient was stable upon discharge.          Final Clinical Impression(s) / ED Diagnoses Final diagnoses:  Right flank pain  Renal cyst  Bilateral inguinal hernia without obstruction or gangrene, recurrence not specified   Umbilical hernia without obstruction and without gangrene  Trichomoniasis    Rx / DC Orders      Silver Wonda LABOR, PA 11/27/23 1418    Albertina Dixon, MD 11/27/23 2154

## 2023-11-27 NOTE — ED Triage Notes (Signed)
 Pt c/o R lower back back that radiates to R lower abd x 1 week also states when she takes a deep breathe it hurts. States it does not hurt all the time but is worse with movement rates pain 0/10 while resting and 10/10 with movement

## 2023-11-27 NOTE — Discharge Instructions (Addendum)
 As discussed, workup today overall reassuring.  CT scan of your abdomen was negative for any acute process.  The CT scan did show fat-containing hernias in both inguinal region as well as around your bellybutton.  Will attach information for a surgeon if these areas begin to become painful.  Your STD testing did test positive for trichomonas.  Will treat this with antibiotic.  Your potassium was also slightly low so we will continue to treat this in the outpatient setting with oral potassium.  Otherwise, labs/CT scan appeared well.  Suspect you could have a muscular injury of your right side as cause of your pain.  Keep an eye on the skin in the area for any development of rash; if this occurs, you could have shingles or herpes zoster and should seek treatment/evaluation by her primary care.  Please not hesitate to return if the worrisome signs and symptoms we discussed become apparent.

## 2023-11-28 LAB — GC/CHLAMYDIA PROBE AMP (~~LOC~~) NOT AT ARMC
Comment: NEGATIVE
Comment: NORMAL

## 2023-12-01 LAB — URINE CULTURE: Culture: 60000 — AB

## 2023-12-02 ENCOUNTER — Telehealth (HOSPITAL_BASED_OUTPATIENT_CLINIC_OR_DEPARTMENT_OTHER): Payer: Self-pay | Admitting: *Deleted

## 2023-12-02 NOTE — Telephone Encounter (Signed)
 Post ED Visit - Positive Culture Follow-up  Culture report reviewed by antimicrobial stewardship pharmacist: [facility] []  Rankin Dee, Pharm.D. []  Venetia Gully, Pharm.D., BCPS AQ-ID []  Garrel Crews, Pharm.D., BCPS []  Almarie Lunger, Pharm.D., BCPS []  Kuna, [place].D., BCPS, AAHIVP []  Rosaline Bihari, Pharm.D., BCPS, AAHIVP []  Vernell Meier, PharmD, BCPS []  Latanya Hint, PharmD, BCPS []  Donald Medley, PharmD, BCPS []  Rocky Bold, PharmD []  Dorothyann Alert, PharmD, BCPS [x]  Dorn Poot, PharmD  [facility] []  Rosaline Edison, PharmD []  Romona Bliss, PharmD []  Dolphus Roller, PharmD []  Veva Seip, Rph []  Vernell Daunt) Leonce, PharmD []  Eva Allis, PharmD []  Rosaline Millet, PharmD []  Iantha Batch, PharmD []  Arvin Gauss, PharmD []  Wanda Hasting, PharmD []  Ronal Rav, PharmD []  Rocky Slade, PharmD []  Bard Jeans, PharmD   Positive urine culture Treated with Cephalexin  and Metronidazole , and no further patient follow-up is required at this time.  Albino Alan Novak 12/02/2023, 11:50 AM

## 2024-01-16 ENCOUNTER — Ambulatory Visit: Payer: Medicaid Other | Admitting: Surgery

## 2024-01-16 ENCOUNTER — Encounter: Payer: Self-pay | Admitting: *Deleted

## 2024-01-16 ENCOUNTER — Encounter: Payer: Self-pay | Admitting: Surgery

## 2024-01-16 ENCOUNTER — Other Ambulatory Visit: Payer: Self-pay | Admitting: *Deleted

## 2024-01-16 VITALS — BP 172/99 | HR 62 | Temp 98.2°F | Resp 14 | Ht 64.0 in | Wt 166.0 lb

## 2024-01-16 DIAGNOSIS — K429 Umbilical hernia without obstruction or gangrene: Secondary | ICD-10-CM

## 2024-01-16 DIAGNOSIS — K4020 Bilateral inguinal hernia, without obstruction or gangrene, not specified as recurrent: Secondary | ICD-10-CM

## 2024-01-16 DIAGNOSIS — K4090 Unilateral inguinal hernia, without obstruction or gangrene, not specified as recurrent: Secondary | ICD-10-CM | POA: Diagnosis not present

## 2024-01-16 NOTE — Progress Notes (Unsigned)
 [facility] History and Physical  Reason for Referral:*** Referring Physician: ***  Chief Complaint   New Patient (Initial Visit)     Kim Richardson is a 63 y.o. female.  HPI: ***.  The *** started *** and has had a duration of ***.  It is associated with ***.  The *** is improved with ***, and is made worse with ***.    Quality*** Context***  Past Medical History:  Diagnosis Date   Cataract of both eyes    Hypertension    noncompliant 09/2011 due to "no money"   PONV (postoperative nausea and vomiting)    Trichomonas 2012   Rx'd flagyl 09/2011   Uterine fibroid     Past Surgical History:  Procedure Laterality Date   ABDOMINAL HYSTERECTOMY     CESAREAN SECTION  1983   [place], [place]   SUPRACERVICAL ABDOMINAL HYSTERECTOMY  01/10/2012   Procedure: HYSTERECTOMY SUPRACERVICAL ABDOMINAL;  Surgeon: Tilda Burrow, MD;  Location: [facility];  Service: Gynecology;  Laterality: N/A;  total abdominal hysterectomy with left oophorectomy   TUBAL LIGATION     at last cesarean    Family History  Problem Relation Age of Onset   Hypertension Mother    Heart failure Mother    Anesthesia problems Neg Hx    Hypotension Neg Hx    Malignant hyperthermia Neg Hx    Pseudochol deficiency Neg Hx     Social History   Tobacco Use   Smoking status: Some Days    Current packs/day: 0.50    Average packs/day: 0.5 packs/day for 39.0 years (19.5 ttl pk-yrs)    Types: Cigarettes   Smokeless tobacco: Never  Vaping Use   Vaping status: Never Used  Substance Use Topics   Alcohol use: Yes    Alcohol/week: 2.0 - 3.0 standard drinks of alcohol    Types: 2 - 3 Cans of beer per week    Comment: daily    Drug use: No    Medications: {medication reviewed/display:3041432} Allergies as of 01/16/2024   No Known Allergies      Medication List        Accurate as of January 16, 2024 10:06 AM. If you have any questions, ask your nurse or doctor.          STOP taking  these medications    cephALEXin 500 MG capsule Commonly known as: KEFLEX Stopped by: Navpreet Szczygiel A Crystallynn Noorani   lidocaine 5 % Commonly known as: Lidoderm Stopped by: Arta Stump A Zan Orlick   metroNIDAZOLE 500 MG tablet Commonly known as: FLAGYL Stopped by: Morrissa Shein A Abby Stines   prasugrel 10 MG Tabs tablet Commonly known as: EFFIENT Stopped by: Sora Vrooman A Hussien Greenblatt   triamcinolone cream 0.5 % Commonly known as: KENALOG Stopped by: Gervis Gaba A Geena Weinhold       TAKE these medications    aspirin EC 81 MG tablet Take by mouth.   atorvastatin 40 MG tablet Commonly known as: LIPITOR Take 40 mg by mouth at bedtime.   losartan 50 MG tablet Commonly known as: COZAAR Take 50 mg by mouth daily.   potassium chloride SA 20 MEQ tablet Commonly known as: KLOR-CON M Take 1 tablet (20 mEq total) by mouth once for 1 dose.         ROS:  {Review of Systems:30496}  Blood pressure (!) 172/99, pulse 62, temperature 98.2 F (36.8 C), temperature source Oral, resp. rate 14, height 5\' 4"  (1.626 m), weight 166 lb (75.3 kg), SpO2 96%. Physical Exam  Results: No results found for this or any previous visit (from the past 48 hours).  No results found.   Assessment & Plan:  RONNE Richardson is a 63 y.o. female with *** -*** -*** -Follow up ***  All questions were answered to the satisfaction of the patient and family***.  The risk and benefits of *** were discussed including but not limited to ***.  After careful consideration, Kim Richardson has decided to ***.    Plummer Matich A Callee Rohrig 01/16/2024, 10:06 AM   Note: Portions of this report may have been transcribed using voice recognition software. Every effort has been made to ensure accuracy; however, inadvertent computerized transcription errors may still be present.   Theophilus Kinds, DO [facility] [address] [place], [place] [address] [phone] (office)

## 2024-01-19 NOTE — H&P (Signed)
 [facility] History and Physical  Reason for Referral: Umbilical hernia, bilateral inguinal hernias Referring Physician: Virgina Jock, NP  Chief Complaint   New Patient (Initial Visit)     Kim Richardson is a 63 y.o. female.  HPI: Patient presents for evaluation of an umbilical and bilateral fat filled inguinal hernias.  She states for the last 2 and half weeks she has had right-sided abdominal pain but goes into the top of her right leg.  Nothing seems to make the pain better or worse.  She describes the pain as a constant sharp pain.  She has not tried any treatments for her pain.  She was evaluated in the emergency department on 1/6, at which time she underwent a CT of the abdomen and pelvis.  The CT demonstrated no acute inflammatory process within the abdomen or pelvis.  It also demonstrated a small fat-containing umbilical and bilateral inguinal hernias.  Her past medical history is significant for 3 different heart attacks in August 2023 at which time she had stents placed.  She is only on an 81 mg aspirin daily.  She last saw her cardiologist earlier this month.  Her other medical history significant for hypertension and hyperlipidemia.  Her surgical history significant for 3 C-sections and a partial hysterectomy.  She smokes a few cigarettes daily and will occasionally drink alcohol.  She denies use of marijuana and illicit drugs.  Past Medical History:  Diagnosis Date   Cataract of both eyes    Hypertension    noncompliant 09/2011 due to "no money"   PONV (postoperative nausea and vomiting)    Trichomonas 2012   Rx'd flagyl 09/2011   Uterine fibroid     Past Surgical History:  Procedure Laterality Date   ABDOMINAL HYSTERECTOMY     CESAREAN SECTION  1983   [place], [place]   SUPRACERVICAL ABDOMINAL HYSTERECTOMY  01/10/2012   Procedure: HYSTERECTOMY SUPRACERVICAL ABDOMINAL;  Surgeon: Tilda Burrow, MD;  Location: [facility];  Service: Gynecology;  Laterality:  N/A;  total abdominal hysterectomy with left oophorectomy   TUBAL LIGATION     at last cesarean    Family History  Problem Relation Age of Onset   Hypertension Mother    Heart failure Mother    Anesthesia problems Neg Hx    Hypotension Neg Hx    Malignant hyperthermia Neg Hx    Pseudochol deficiency Neg Hx     Social History   Tobacco Use   Smoking status: Some Days    Current packs/day: 0.50    Average packs/day: 0.5 packs/day for 39.0 years (19.5 ttl pk-yrs)    Types: Cigarettes   Smokeless tobacco: Never  Vaping Use   Vaping status: Never Used  Substance Use Topics   Alcohol use: Yes    Alcohol/week: 2.0 - 3.0 standard drinks of alcohol    Types: 2 - 3 Cans of beer per week    Comment: daily    Drug use: No    Medications: I have reviewed the patient's current medications. Allergies as of 01/16/2024   No Known Allergies      Medication List        Accurate as of January 16, 2024 10:06 AM. If you have any questions, ask your nurse or doctor.          STOP taking these medications    cephALEXin 500 MG capsule Commonly known as: KEFLEX Stopped by: Amaiah Cristiano A Kaitelyn Jamison   lidocaine 5 % Commonly known as: Lidoderm  Stopped by: Santina Evans A Khristine Verno   metroNIDAZOLE 500 MG tablet Commonly known as: FLAGYL Stopped by: Gid Schoffstall A Samadhi Mahurin   prasugrel 10 MG Tabs tablet Commonly known as: EFFIENT Stopped by: Jaber Dunlow A Delayni Streed   triamcinolone cream 0.5 % Commonly known as: KENALOG Stopped by: Mathieu Schloemer A Olanda Boughner       TAKE these medications    aspirin EC 81 MG tablet Take by mouth.   atorvastatin 40 MG tablet Commonly known as: LIPITOR Take 40 mg by mouth at bedtime.   losartan 50 MG tablet Commonly known as: COZAAR Take 50 mg by mouth daily.   potassium chloride SA 20 MEQ tablet Commonly known as: KLOR-CON M Take 1 tablet (20 mEq total) by mouth once for 1 dose.         ROS:  Constitutional: negative for chills,  fatigue, and fevers Eyes: negative for visual disturbance and pain Ears, nose, mouth, throat, and face: negative for ear drainage, sore throat, and sinus problems Respiratory: negative for cough, wheezing, and shortness of breath Cardiovascular: negative for chest pain and palpitations Gastrointestinal: positive for abdominal pain, negative for nausea, reflux symptoms, and vomiting Genitourinary:negative for dysuria and frequency Integument/breast: negative for dryness and rash Hematologic/lymphatic: negative for bleeding and lymphadenopathy Musculoskeletal:negative for back pain and neck pain Neurological: negative for dizziness and tremors Endocrine: negative for temperature intolerance  Blood pressure (!) 172/99, pulse 62, temperature 98.2 F (36.8 C), temperature source Oral, resp. rate 14, height 5\' 4"  (1.626 m), weight 166 lb (75.3 kg), SpO2 96%. Physical Exam Vitals reviewed.  Constitutional:      Appearance: Normal appearance.  HENT:     Head: Normocephalic and atraumatic.  Eyes:     Extraocular Movements: Extraocular movements intact.     Pupils: Pupils are equal, round, and reactive to light.  Cardiovascular:     Rate and Rhythm: Normal rate and regular rhythm.  Pulmonary:     Effort: Pulmonary effort is normal.     Breath sounds: Normal breath sounds.  Abdominal:     Comments: Abdomen soft, nondistended, no percussion tenderness, nontender to palation; no rigidity, guarding, or rebound tenderness; soft and reducible umbilical hernia, nontender; soft and reducible bilateral inguinal hernias, left nontender, right TTP  Musculoskeletal:        General: Normal range of motion.     Cervical back: Normal range of motion.  Skin:    General: Skin is warm and dry.  Neurological:     General: No focal deficit present.     Mental Status: She is alert and oriented to person, place, and time.  Psychiatric:        Mood and Affect: Mood normal.        Behavior: Behavior normal.      Results: CT abdomen and pelvis (11/27/23): IMPRESSION: *No acute inflammatory process identified within the abdomen or pelvis. Unremarkable appendix. *Multiple other nonacute observations, as described above.  Other: There are fat containing umbilical and bilateral inguinal hernias. The soft tissues and abdominal wall are otherwise unremarkable.  Assessment & Plan:  ROSMERY DUGGIN is a 63 y.o. female who presents for evaluation of an umbilical and bilateral inguinal hernias.  -I discussed the pathophysiology of hernias and we discussed the need for surgical repair vs clinically monitoring.  We further discussed that her pain in the right side of her abdomen could be partially related to her inguinal hernia, or could be unrelated.  Since she is not having any pain at her umbilicus or in  her left groin, we discussed clinically monitoring the hernias for any symptoms rather than surgical interventions at this time. -The risk and benefits of robotic assisted laparoscopic right inguinal hernia repair with mesh were discussed including but not limited to bleeding, infection, injury to surrounding structures, need for additional procedures, and hernia recurrence.  After careful consideration, Adahlia P Borrero has decided to proceed with surgery.  -Patient tentatively scheduled for surgery on 3/11 -We will reach out to the patient's cardiologist to verify there are no issues with proceeding with surgery -Information provided to the patient regarding inguinal hernias -Advised that the patient should present to the ED if they begin to have painful nonreducible bulge in either groin or at her umbilicus, nausea, vomiting, and obstipation  All questions were answered to the satisfaction of the patient and family.  Note: Portions of this report may have been transcribed using voice recognition software. Every effort has been made to ensure accuracy; however, inadvertent computerized transcription  errors may still be present.   Theophilus Kinds, DO [facility] [address] [place], [place] [address] [phone] (office)

## 2024-01-25 NOTE — Patient Instructions (Signed)
 Kim Richardson  01/25/2024     @PREFPERIOPPHARMACY @   Your procedure is scheduled on  01/30/2024.   Report to [facility] at  0600 A.M.   Call this number if you have problems the morning of surgery:  [phone]  If you experience any cold or flu symptoms such as cough, fever, chills, shortness of breath, etc. between now and your scheduled surgery, please notify us at the above number.   Remember:  Do not eat after midnight.   You may drink clear liquids until 0330 am on 01/30/2024.    Clear liquids allowed are:                    Water, Juice (No red color; non-citric and without pulp; diabetics please choose diet or no sugar options), Carbonated beverages (diabetics please choose diet or no sugar options), Clear Tea (No creamer, milk, or cream, including half & half and powdered creamer), Black Coffee Only (No creamer, milk or cream, including half & half and powdered creamer), and Clear Sports drink (No red color; diabetics please choose diet or no sugar options)    Take these medicines the morning of surgery with A SIP OF WATER                                            None.    Do not wear jewelry, make-up or nail polish, including gel polish,  artificial nails, or any other type of covering on natural nails (fingers and  toes).  Do not wear lotions, powders, or perfumes, or deodorant.  Do not shave 48 hours prior to surgery.  Men may shave face and neck.  Do not bring valuables to the hospital.  [facility] is not responsible for any belongings or valuables.  Contacts, dentures or bridgework may not be worn into surgery.  Leave your suitcase in the car.  After surgery it may be brought to your room.  For patients admitted to the hospital, discharge time will be determined by your treatment team.  Patients discharged the day of surgery will not be allowed to drive home and must have someone with them for 24 hours.    Special instructions:   DO NOT smoke tobacco  or vape for 24 hours before your procedure.  Please read over the following fact sheets that you were given. Coughing and Deep Breathing, Surgical Site Infection Prevention, Anesthesia Post-op Instructions, and Care and Recovery After Surgery         Laparoscopic Inguinal Hernia Repair, Adult, Care After The following information offers guidance on how to care for yourself after your procedure. Your health care provider may also give you more specific instructions. If you have problems or questions, contact your health care provider. What can I expect after the procedure? After the procedure, it is common to have: Pain. Swelling and bruising around the incision area. Scrotal swelling, in males. Some fluid or blood draining from your incisions. Follow these instructions at home: Medicines Take over-the-counter and prescription medicines only as told by your health care provider. Ask your health care provider if the medicine prescribed to you: Requires you to avoid driving or using machinery. Can cause constipation. You may need to take these actions to prevent or treat constipation: Drink enough fluid to keep your urine pale yellow. Take over-the-counter or prescription  medicines. Eat foods that are high in fiber, such as beans, whole grains, and fresh fruits and vegetables. Limit foods that are high in fat and processed sugars, such as fried or sweet foods. Incision care  Follow instructions from your health care provider about how to take care of your incisions. Make sure you: Wash your hands with soap and water for at least 20 seconds before and after you change your bandage (dressing). If soap and water are not available, use hand sanitizer. Change your dressing as told by your health care provider. Leave stitches (sutures), skin glue, or adhesive strips in place. These skin closures may need to stay in place for 2 weeks or longer. If adhesive strip edges start to loosen and curl  up, you may trim the loose edges. Do not remove adhesive strips completely unless your health care provider tells you to do that. Check your incision area every day for signs of infection. Check for: More redness, swelling, or pain. More fluid or blood. Warmth. Pus or a bad smell. Wear loose, soft clothing while your incisions heal. Managing pain and swelling If directed, put ice on the painful or swollen areas. To do this: Put ice in a plastic bag. Place a towel between your skin and the bag. Leave the ice on for 20 minutes, 2-3 times a day. Remove the ice if your skin turns bright red. This is very important. If you cannot feel pain, heat, or cold, you have a greater risk of damage to the area.  Activity Do not lift anything that is heavier than 10 lb (4.5 kg), or the limit that you are told, until your health care provider says that it is safe. Ask your health care provider what activities are safe for you. A lot of activity during the first week after surgery can increase pain and swelling. For 1 week after your procedure: Avoid activities that take a lot of effort, such as exercise or sports. You may walk and climb stairs as needed for daily activity, but avoid long walks or climbing stairs for exercise. General instructions If you were given a sedative during the procedure, it can affect you for several hours. Do not drive or operate machinery until your health care provider says that it is safe. Do not take baths, swim, or use a hot tub until your health care provider approves. Ask your health care provider if you may take showers. You may only be allowed to take sponge baths. Do not use any products that contain nicotine or tobacco. These products include cigarettes, chewing tobacco, and vaping devices, such as e-cigarettes. If you need help quitting, ask your health care provider. Keep all follow-up visits. This is important. Contact a health care provider if: You have any of these  signs of infection: More redness, swelling, or pain around your incisions or your groin area. More fluid or blood coming from an incision. Warmth coming from an incision. Pus or a bad smell coming from an incision. A fever or chills. You have more swelling in your scrotum, if you are female. You have severe pain and medicines do not help. You have abdominal pain or swelling. You cannot urinate or have a bowel movement. You faint or feel dizzy. You have nausea and vomiting. Get help right away if: You have redness, warmth, or pain in your leg. You have chest pain. You have problems breathing. These symptoms may represent a serious problem that is an emergency. Do not wait to see  if the symptoms will go away. Get medical help right away. Call your local emergency services (911 in the [place] Do not drive yourself to the hospital. Summary Pain, swelling, and bruising are common after the procedure. Check your incision area every day for signs of infection, such as more redness, swelling, or pain. Put ice on painful or swollen areas for 20 minutes, 2-3 times a day. This information is not intended to replace advice given to you by your health care provider. Make sure you discuss any questions you have with your health care provider. Document Revised: 07/07/2020 Document Reviewed: 07/07/2020 Elsevier Patient Education  2024 Elsevier Inc.General Anesthesia, Adult, Care After The following information offers guidance on how to care for yourself after your procedure. Your health care provider may also give you more specific instructions. If you have problems or questions, contact your health care provider. What can I expect after the procedure? After the procedure, it is common for people to: Have pain or discomfort at the IV site. Have nausea or vomiting. Have a sore throat or hoarseness. Have trouble concentrating. Feel cold or chills. Feel weak, sleepy, or tired (fatigue). Have soreness and  body aches. These can affect parts of the body that were not involved in surgery. Follow these instructions at home: For the time period you were told by your health care provider:  Rest. Do not participate in activities where you could fall or become injured. Do not drive or use machinery. Do not drink alcohol. Do not take sleeping pills or medicines that cause drowsiness. Do not make important decisions or sign legal documents. Do not take care of children on your own. General instructions Drink enough fluid to keep your urine pale yellow. If you have sleep apnea, surgery and certain medicines can increase your risk for breathing problems. Follow instructions from your health care provider about wearing your sleep device: Anytime you are sleeping, including during daytime naps. While taking prescription pain medicines, sleeping medicines, or medicines that make you drowsy. Return to your normal activities as told by your health care provider. Ask your health care provider what activities are safe for you. Take over-the-counter and prescription medicines only as told by your health care provider. Do not use any products that contain nicotine or tobacco. These products include cigarettes, chewing tobacco, and vaping devices, such as e-cigarettes. These can delay incision healing after surgery. If you need help quitting, ask your health care provider. Contact a health care provider if: You have nausea or vomiting that does not get better with medicine. You vomit every time you eat or drink. You have pain that does not get better with medicine. You cannot urinate or have bloody urine. You develop a skin rash. You have a fever. Get help right away if: You have trouble breathing. You have chest pain. You vomit blood. These symptoms may be an emergency. Get help right away. Call 911. Do not wait to see if the symptoms will go away. Do not drive yourself to the hospital. Summary After the  procedure, it is common to have a sore throat, hoarseness, nausea, vomiting, or to feel weak, sleepy, or fatigue. For the time period you were told by your health care provider, do not drive or use machinery. Get help right away if you have difficulty breathing, have chest pain, or vomit blood. These symptoms may be an emergency. This information is not intended to replace advice given to you by your health care provider. Make sure you discuss  any questions you have with your health care provider. Document Revised: 02/04/2022 Document Reviewed: 02/04/2022 Elsevier Patient Education  2024 Elsevier Inc.How to Use Chlorhexidine at Home in the Shower Chlorhexidine gluconate (CHG) is a germ-killing (antiseptic) wash that's used to clean the skin. It can get rid of the germs that normally live on the skin and can keep them away for about 24 hours. If you're having surgery, you may be told to shower with CHG at home the night before surgery. This can help lower your risk for infection. To use CHG wash in the shower, follow the steps below. Supplies needed: CHG body wash. Clean washcloth. Clean towel. How to use CHG in the shower Follow these steps unless you're told to use CHG in a different way: Start the shower. Use your normal soap and shampoo to wash your face and hair. Turn off the shower or move out of the shower stream. Pour CHG onto a clean washcloth. Do not use any type of brush or rough sponge. Start at your neck, washing your body down to your toes. Make sure you: Wash the part of your body where the surgery will be done for at least 1 minute. Do not scrub. Do not use CHG on your head or face unless your health care provider tells you to. If it gets into your ears or eyes, rinse them well with water. Do not wash your genitals with CHG. Wash your back and under your arms. Make sure to wash skin folds. Let the CHG sit on your skin for 1-2 minutes or as long as told. Rinse your entire body  in the shower, including all body creases and folds. Turn off the shower. Dry off with a clean towel. Do not put anything on your skin afterward, such as powder, lotion, or perfume. Put on clean clothes or pajamas. If it's the night before surgery, sleep in clean sheets. General tips Use CHG only as told, and follow the instructions on the label. Use the full amount of CHG as told. This is often one bottle. Do not smoke and stay away from flames after using CHG. Your skin may feel sticky after using CHG. This is normal. The sticky feeling will go away as the CHG dries. Do not use CHG: If you have a chlorhexidine allergy or have reacted to chlorhexidine in the past. On open wounds or areas of skin that have broken skin, cuts, or scrapes. On babies younger than 5 months of age. Contact a health care provider if: You have questions about using CHG. Your skin gets irritated or itchy. You have a rash after using CHG. You swallow any CHG. Call your local poison control center [phone] in the [place] Your eyes itch badly, or they become very red or swollen. Your hearing changes. You have trouble seeing. If you can't reach your provider, go to an urgent care or emergency room. Do not drive yourself. Get help right away if: You have swelling or tingling in your mouth or throat. You make high-pitched whistling sounds when you breathe, most often when you breathe out (wheeze). You have trouble breathing. These symptoms may be an emergency. Call 911 right away. Do not wait to see if the symptoms will go away. Do not drive yourself to the hospital. This information is not intended to replace advice given to you by your health care provider. Make sure you discuss any questions you have with your health care provider. Document Revised: 05/23/2023 Document Reviewed: 05/19/2022 Elsevier Patient  Education  2024 [employer].

## 2024-01-26 ENCOUNTER — Encounter (HOSPITAL_COMMUNITY)
Admission: RE | Admit: 2024-01-26 | Discharge: 2024-01-26 | Disposition: A | Discharge status: Home / Self Care | Payer: Medicaid Other | Source: Ambulatory Visit | Attending: Surgery | Admitting: Surgery

## 2024-01-26 VITALS — BP 172/88 | HR 62 | Temp 98.2°F | Resp 18 | Ht 64.0 in | Wt 166.0 lb

## 2024-01-26 DIAGNOSIS — E876 Hypokalemia: Secondary | ICD-10-CM | POA: Insufficient documentation

## 2024-01-26 DIAGNOSIS — Z01818 Encounter for other preprocedural examination: Secondary | ICD-10-CM | POA: Insufficient documentation

## 2024-01-26 DIAGNOSIS — I10 Essential (primary) hypertension: Secondary | ICD-10-CM | POA: Insufficient documentation

## 2024-01-26 LAB — BASIC METABOLIC PANEL
Anion gap: 9 (ref 5–15)
BUN: 16 mg/dL (ref 8–23)
CO2: 22 mmol/L (ref 22–32)
Calcium: 8.7 mg/dL — ABNORMAL LOW (ref 8.9–10.3)
Chloride: 107 mmol/L (ref 98–111)
Creatinine, Ser: 0.78 mg/dL (ref 0.44–1.00)
GFR, Estimated: >60 mL/min (ref >60)
Glucose, Bld: 113 mg/dL — ABNORMAL HIGH (ref 70–99)
Potassium: 3.5 mmol/L (ref 3.5–5.1)
Sodium: 138 mmol/L (ref 135–145)

## 2024-01-30 ENCOUNTER — Ambulatory Visit (HOSPITAL_COMMUNITY): Payer: Self-pay | Admitting: Anesthesiology

## 2024-01-30 ENCOUNTER — Encounter (HOSPITAL_COMMUNITY): Payer: Self-pay | Admitting: Surgery

## 2024-01-30 ENCOUNTER — Encounter (HOSPITAL_COMMUNITY)
Admission: RE | Disposition: A | Discharge status: Home / Self Care | Payer: Self-pay | Source: Home / Self Care | Attending: Surgery

## 2024-01-30 ENCOUNTER — Other Ambulatory Visit: Payer: Self-pay

## 2024-01-30 ENCOUNTER — Observation Stay (HOSPITAL_COMMUNITY)
Admission: RE | Admit: 2024-01-30 | Discharge: 2024-01-31 | Disposition: A | Discharge status: Home / Self Care | Payer: Medicaid Other | Attending: Surgery | Admitting: Surgery

## 2024-01-30 ENCOUNTER — Ambulatory Visit (HOSPITAL_BASED_OUTPATIENT_CLINIC_OR_DEPARTMENT_OTHER): Payer: Self-pay | Admitting: Anesthesiology

## 2024-01-30 DIAGNOSIS — F17210 Nicotine dependence, cigarettes, uncomplicated: Secondary | ICD-10-CM | POA: Insufficient documentation

## 2024-01-30 DIAGNOSIS — I10 Essential (primary) hypertension: Secondary | ICD-10-CM | POA: Diagnosis not present

## 2024-01-30 DIAGNOSIS — K660 Peritoneal adhesions (postprocedural) (postinfection): Secondary | ICD-10-CM

## 2024-01-30 DIAGNOSIS — K4090 Unilateral inguinal hernia, without obstruction or gangrene, not specified as recurrent: Secondary | ICD-10-CM

## 2024-01-30 DIAGNOSIS — Z8719 Personal history of other diseases of the digestive system: Principal | ICD-10-CM

## 2024-01-30 DIAGNOSIS — K429 Umbilical hernia without obstruction or gangrene: Secondary | ICD-10-CM

## 2024-01-30 HISTORY — PX: ROBOTIC ASSISTED LAPAROSCOPIC LYSIS OF ADHESION: SHX6080

## 2024-01-30 HISTORY — PX: XI ROBOTIC ASSISTED INGUINAL HERNIA REPAIR WITH MESH: SHX6706

## 2024-01-30 SURGERY — REPAIR, HERNIA, INGUINAL, ROBOT-ASSISTED, LAPAROSCOPIC, USING MESH
Anesthesia: General | Site: Abdomen | Laterality: Right

## 2024-01-30 MED ORDER — ONDANSETRON HCL 4 MG/2ML IJ SOLN: 4.0000 mg | Freq: Once | INTRAMUSCULAR | Status: DC | PRN

## 2024-01-30 MED ORDER — ONDANSETRON HCL 4 MG/2ML IJ SOLN
4.0000 mg | Freq: Four times a day (QID) | INTRAMUSCULAR | Status: DC | PRN
Start: 2024-01-30 — End: 2024-01-31

## 2024-01-30 MED ORDER — DEXMEDETOMIDINE HCL IN NACL 80 MCG/20ML IV SOLN
INTRAVENOUS | Status: DC | PRN
  Administered 2024-01-30 (×2): 8 ug via INTRAVENOUS

## 2024-01-30 MED ORDER — ACETAMINOPHEN 500 MG PO TABS: 1000.0000 mg | ORAL_TABLET | Freq: Four times a day (QID) | ORAL | 0 refills | Status: AC

## 2024-01-30 MED ORDER — ONDANSETRON 4 MG PO TBDP
4.0000 mg | ORAL_TABLET | Freq: Four times a day (QID) | ORAL | Status: DC | PRN
  Administered 2024-01-31: 4 mg via ORAL
  Filled 2024-01-30: qty 1

## 2024-01-30 MED ORDER — PROPOFOL 500 MG/50ML IV EMUL
INTRAVENOUS | Status: AC
  Filled 2024-01-30: qty 50

## 2024-01-30 MED ORDER — CHLORHEXIDINE GLUCONATE 0.12 % MT SOLN
15.0000 mL | Freq: Once | OROMUCOSAL | Status: AC
  Administered 2024-01-30: 15 mL via OROMUCOSAL

## 2024-01-30 MED ORDER — ROCURONIUM BROMIDE 10 MG/ML (PF) SYRINGE
PREFILLED_SYRINGE | INTRAVENOUS | Status: AC
  Filled 2024-01-30: qty 10

## 2024-01-30 MED ORDER — ASPIRIN 81 MG PO TBEC: 81.0000 mg | DELAYED_RELEASE_TABLET | Freq: Every day | ORAL | Status: AC

## 2024-01-30 MED ORDER — OXYCODONE HCL 5 MG PO TABS: 5.0000 mg | ORAL_TABLET | Freq: Four times a day (QID) | ORAL | 0 refills | Status: DC | PRN

## 2024-01-30 MED ORDER — FENTANYL CITRATE (PF) 100 MCG/2ML IJ SOLN
INTRAMUSCULAR | Status: AC
  Filled 2024-01-30: qty 2

## 2024-01-30 MED ORDER — DEXAMETHASONE SODIUM PHOSPHATE 10 MG/ML IJ SOLN
INTRAMUSCULAR | Status: AC
  Filled 2024-01-30: qty 1

## 2024-01-30 MED ORDER — LACTATED RINGERS IV SOLN: INTRAVENOUS | Status: DC

## 2024-01-30 MED ORDER — CHLORHEXIDINE GLUCONATE CLOTH 2 % EX PADS: 6.0000 | MEDICATED_PAD | Freq: Once | CUTANEOUS | Status: DC

## 2024-01-30 MED ORDER — ATORVASTATIN CALCIUM 40 MG PO TABS
40.0000 mg | ORAL_TABLET | Freq: Every day | ORAL | Status: DC
  Administered 2024-01-30: 40 mg via ORAL
  Filled 2024-01-30: qty 1

## 2024-01-30 MED ORDER — SCOPOLAMINE 1 MG/3DAYS TD PT72
MEDICATED_PATCH | TRANSDERMAL | Status: DC | PRN
  Administered 2024-01-30: 1 via TRANSDERMAL

## 2024-01-30 MED ORDER — PROPOFOL 10 MG/ML IV BOLUS
INTRAVENOUS | Status: DC | PRN
  Administered 2024-01-30: 50 mg via INTRAVENOUS
  Administered 2024-01-30: 150 mg via INTRAVENOUS

## 2024-01-30 MED ORDER — DOCUSATE SODIUM 100 MG PO CAPS: 100.0000 mg | ORAL_CAPSULE | Freq: Two times a day (BID) | ORAL | 2 refills | Status: DC

## 2024-01-30 MED ORDER — DEXMEDETOMIDINE HCL IN NACL 80 MCG/20ML IV SOLN
INTRAVENOUS | Status: AC
  Filled 2024-01-30: qty 20

## 2024-01-30 MED ORDER — FENTANYL CITRATE PF 50 MCG/ML IJ SOSY
25.0000 ug | PREFILLED_SYRINGE | INTRAMUSCULAR | Status: DC | PRN
  Administered 2024-01-30 (×2): 50 ug via INTRAVENOUS
  Filled 2024-01-30 (×2): qty 1

## 2024-01-30 MED ORDER — KETOROLAC TROMETHAMINE 30 MG/ML IJ SOLN
INTRAMUSCULAR | Status: DC | PRN
  Administered 2024-01-30: 30 mg via INTRAVENOUS

## 2024-01-30 MED ORDER — PROPOFOL 500 MG/50ML IV EMUL
INTRAVENOUS | Status: DC | PRN
  Administered 2024-01-30: 25 ug/kg/min via INTRAVENOUS

## 2024-01-30 MED ORDER — ONDANSETRON HCL 4 MG PO TABS: 4.0000 mg | ORAL_TABLET | Freq: Every day | ORAL | 1 refills | Status: DC | PRN

## 2024-01-30 MED ORDER — FENTANYL CITRATE (PF) 100 MCG/2ML IJ SOLN
INTRAMUSCULAR | Status: DC | PRN
  Administered 2024-01-30 (×4): 50 ug via INTRAVENOUS

## 2024-01-30 MED ORDER — PHENYLEPHRINE HCL-NACL 20-0.9 MG/250ML-% IV SOLN
INTRAVENOUS | Status: AC
  Filled 2024-01-30: qty 250

## 2024-01-30 MED ORDER — PROPOFOL 10 MG/ML IV BOLUS
INTRAVENOUS | Status: AC
  Filled 2024-01-30: qty 20

## 2024-01-30 MED ORDER — SIMETHICONE 80 MG PO CHEW
80.0000 mg | CHEWABLE_TABLET | Freq: Four times a day (QID) | ORAL | Status: DC | PRN
  Administered 2024-01-30: 80 mg via ORAL
  Filled 2024-01-30: qty 1

## 2024-01-30 MED ORDER — FENTANYL CITRATE (PF) 100 MCG/2ML IJ SOLN
INTRAMUSCULAR | Status: AC
Start: 2024-01-30 — End: ?
  Filled 2024-01-30: qty 2

## 2024-01-30 MED ORDER — CEFAZOLIN SODIUM-DEXTROSE 2-4 GM/100ML-% IV SOLN
INTRAVENOUS | Status: AC
Start: 2024-01-30 — End: 2024-01-30
  Filled 2024-01-30: qty 100

## 2024-01-30 MED ORDER — STERILE WATER FOR IRRIGATION IR SOLN
Status: DC | PRN
  Administered 2024-01-30: 500 mL

## 2024-01-30 MED ORDER — ORAL CARE MOUTH RINSE: 15.0000 mL | Freq: Once | OROMUCOSAL | Status: AC

## 2024-01-30 MED ORDER — LIDOCAINE HCL (PF) 2 % IJ SOLN
INTRAMUSCULAR | Status: DC | PRN
  Administered 2024-01-30: 100 mg via INTRADERMAL

## 2024-01-30 MED ORDER — ONDANSETRON HCL 4 MG/2ML IJ SOLN
INTRAMUSCULAR | Status: DC | PRN
  Administered 2024-01-30: 4 mg via INTRAVENOUS

## 2024-01-30 MED ORDER — DEXAMETHASONE SODIUM PHOSPHATE 10 MG/ML IJ SOLN
INTRAMUSCULAR | Status: DC | PRN
  Administered 2024-01-30: 5 mg via INTRAVENOUS

## 2024-01-30 MED ORDER — PHENYLEPHRINE 80 MCG/ML (10ML) SYRINGE FOR IV PUSH (FOR BLOOD PRESSURE SUPPORT)
PREFILLED_SYRINGE | INTRAVENOUS | Status: DC | PRN
  Administered 2024-01-30 (×3): 80 ug via INTRAVENOUS

## 2024-01-30 MED ORDER — BUPIVACAINE HCL (PF) 0.5 % IJ SOLN
INTRAMUSCULAR | Status: AC
Start: 2024-01-30 — End: ?
  Filled 2024-01-30: qty 30

## 2024-01-30 MED ORDER — PHENYLEPHRINE 80 MCG/ML (10ML) SYRINGE FOR IV PUSH (FOR BLOOD PRESSURE SUPPORT)
PREFILLED_SYRINGE | INTRAVENOUS | Status: AC
  Filled 2024-01-30: qty 10

## 2024-01-30 MED ORDER — EPHEDRINE 5 MG/ML INJ
INTRAVENOUS | Status: AC
  Filled 2024-01-30: qty 5

## 2024-01-30 MED ORDER — SUGAMMADEX SODIUM 200 MG/2ML IV SOLN
INTRAVENOUS | Status: DC | PRN
  Administered 2024-01-30: 200 mg via INTRAVENOUS

## 2024-01-30 MED ORDER — OXYCODONE HCL 5 MG/5ML PO SOLN: 5.0000 mg | Freq: Once | ORAL | Status: DC | PRN

## 2024-01-30 MED ORDER — SUGAMMADEX SODIUM 200 MG/2ML IV SOLN
INTRAVENOUS | Status: AC
  Filled 2024-01-30: qty 2

## 2024-01-30 MED ORDER — ACETAMINOPHEN 500 MG PO TABS
1000.0000 mg | ORAL_TABLET | Freq: Four times a day (QID) | ORAL | Status: DC
  Administered 2024-01-30 – 2024-01-31 (×4): 1000 mg via ORAL
  Filled 2024-01-30 (×5): qty 2

## 2024-01-30 MED ORDER — MIDAZOLAM HCL 2 MG/2ML IJ SOLN
INTRAMUSCULAR | Status: AC
  Filled 2024-01-30: qty 2

## 2024-01-30 MED ORDER — ENOXAPARIN SODIUM 40 MG/0.4ML IJ SOSY: 40.0000 mg | PREFILLED_SYRINGE | INTRAMUSCULAR | Status: DC

## 2024-01-30 MED ORDER — ONDANSETRON HCL 4 MG/2ML IJ SOLN
INTRAMUSCULAR | Status: AC
  Filled 2024-01-30: qty 2

## 2024-01-30 MED ORDER — ROCURONIUM BROMIDE 10 MG/ML (PF) SYRINGE
PREFILLED_SYRINGE | INTRAVENOUS | Status: DC | PRN
  Administered 2024-01-30: 60 mg via INTRAVENOUS
  Administered 2024-01-30 (×3): 10 mg via INTRAVENOUS

## 2024-01-30 MED ORDER — SCOPOLAMINE 1 MG/3DAYS TD PT72
MEDICATED_PATCH | TRANSDERMAL | Status: AC
  Filled 2024-01-30: qty 1

## 2024-01-30 MED ORDER — LIDOCAINE HCL (PF) 2 % IJ SOLN
INTRAMUSCULAR | Status: AC
  Filled 2024-01-30: qty 5

## 2024-01-30 MED ORDER — CEFAZOLIN SODIUM-DEXTROSE 2-4 GM/100ML-% IV SOLN
2.0000 g | INTRAVENOUS | Status: AC
  Administered 2024-01-30: 2 g via INTRAVENOUS

## 2024-01-30 MED ORDER — OXYCODONE HCL 5 MG PO TABS: 5.0000 mg | ORAL_TABLET | Freq: Once | ORAL | Status: DC | PRN

## 2024-01-30 MED ORDER — BUPIVACAINE HCL (PF) 0.5 % IJ SOLN
INTRAMUSCULAR | Status: DC | PRN
  Administered 2024-01-30: 30 mL

## 2024-01-30 MED ORDER — MIDAZOLAM HCL 2 MG/2ML IJ SOLN
INTRAMUSCULAR | Status: DC | PRN
  Administered 2024-01-30: 2 mg via INTRAVENOUS

## 2024-01-30 MED ORDER — ACETAMINOPHEN 10 MG/ML IV SOLN
INTRAVENOUS | Status: AC
  Filled 2024-01-30: qty 100

## 2024-01-30 MED ORDER — EPHEDRINE SULFATE-NACL 50-0.9 MG/10ML-% IV SOSY
PREFILLED_SYRINGE | INTRAVENOUS | Status: DC | PRN
Start: 2024-01-30 — End: 2024-01-30
  Administered 2024-01-30: 10 mg via INTRAVENOUS
  Administered 2024-01-30 (×2): 5 mg via INTRAVENOUS

## 2024-01-30 MED ORDER — ACETAMINOPHEN 10 MG/ML IV SOLN
INTRAVENOUS | Status: DC | PRN
Start: 2024-01-30 — End: 2024-01-30
  Administered 2024-01-30: 1000 mg via INTRAVENOUS

## 2024-01-30 MED ORDER — OXYCODONE HCL 5 MG PO TABS
5.0000 mg | ORAL_TABLET | Freq: Four times a day (QID) | ORAL | Status: DC | PRN
  Administered 2024-01-30 – 2024-01-31 (×4): 5 mg via ORAL
  Filled 2024-01-30 (×5): qty 1

## 2024-01-30 MED ORDER — KETOROLAC TROMETHAMINE 30 MG/ML IJ SOLN
INTRAMUSCULAR | Status: AC
  Filled 2024-01-30: qty 1

## 2024-01-30 MED ORDER — LOSARTAN POTASSIUM 50 MG PO TABS
50.0000 mg | ORAL_TABLET | Freq: Every day | ORAL | Status: DC
  Administered 2024-01-30 – 2024-01-31 (×2): 50 mg via ORAL
  Filled 2024-01-30 (×2): qty 1

## 2024-01-30 SURGICAL SUPPLY — 51 items
BINDER ABDOMINAL 12 ML 46-62 (SOFTGOODS) IMPLANT
BLADE SURG 15 STRL LF DISP TIS (BLADE) ×3 IMPLANT
CHLORAPREP W/TINT 26 (MISCELLANEOUS) ×3 IMPLANT
COVER LIGHT HANDLE STERIS (MISCELLANEOUS) ×6 IMPLANT
COVER MAYO STAND XLG (MISCELLANEOUS) ×3 IMPLANT
COVER TIP SHEARS 8 DVNC (MISCELLANEOUS) ×3 IMPLANT
DEFOGGER SCOPE WARMER CLEARIFY (MISCELLANEOUS) ×3 IMPLANT
DERMABOND ADVANCED .7 DNX12 (GAUZE/BANDAGES/DRESSINGS) ×3 IMPLANT
DRAPE ARM DVNC X/XI (DISPOSABLE) ×9 IMPLANT
DRAPE COLUMN DVNC XI (DISPOSABLE) ×3 IMPLANT
DRIVER NDL MEGA SUTCUT DVNCXI (INSTRUMENTS) ×3 IMPLANT
DRIVER NDLE MEGA SUTCUT DVNCXI (INSTRUMENTS) ×3 IMPLANT
ELECT REM PT RETURN 9FT ADLT (ELECTROSURGICAL) ×3 IMPLANT
ELECTRODE REM PT RTRN 9FT ADLT (ELECTROSURGICAL) ×3 IMPLANT
FORCEPS BPLR R/ABLATION 8 DVNC (INSTRUMENTS) ×3 IMPLANT
GAUZE SPONGE 4X4 12PLY STRL (GAUZE/BANDAGES/DRESSINGS) ×3 IMPLANT
GLOVE BIOGEL PI IND STRL 6.5 (GLOVE) ×6 IMPLANT
GLOVE BIOGEL PI IND STRL 7.0 (GLOVE) ×12 IMPLANT
GLOVE SURG SS PI 6.5 STRL IVOR (GLOVE) ×6 IMPLANT
GOWN STRL REUS W/TWL LRG LVL3 (GOWN DISPOSABLE) ×9 IMPLANT
GRASPER SUT TROCAR 14GX15 (MISCELLANEOUS) IMPLANT
KIT PINK PAD W/HEAD ARE REST (MISCELLANEOUS) ×3 IMPLANT
KIT PINK PAD W/HEAD ARM REST (MISCELLANEOUS) ×3 IMPLANT
KIT TURNOVER KIT A (KITS) ×3 IMPLANT
MANIFOLD NEPTUNE II (INSTRUMENTS) ×3 IMPLANT
MESH PROGRIP LAP SLF FIX 16X12 (Mesh General) ×3 IMPLANT
MESH VENTRALIGHT ST 4.5IN (Mesh General) IMPLANT
NDL HYPO 21X1 ECLIPSE (NEEDLE) ×3 IMPLANT
NDL INSUFFLATION 14GA 120MM (NEEDLE) ×3 IMPLANT
NEEDLE HYPO 21X1 ECLIPSE (NEEDLE) ×3 IMPLANT
NEEDLE INSUFFLATION 14GA 120MM (NEEDLE) ×3 IMPLANT
OBTURATOR OPTICAL STND 8 DVNC (TROCAR) ×3 IMPLANT
OBTURATOR OPTICALSTD 8 DVNC (TROCAR) ×3 IMPLANT
PACK LAP CHOLE LZT030E (CUSTOM PROCEDURE TRAY) ×3 IMPLANT
PENCIL HANDSWITCHING (ELECTRODE) ×3 IMPLANT
POSITIONER HEAD 8X9X4 ADT (SOFTGOODS) ×3 IMPLANT
SCISSORS MNPLR CVD DVNC XI (INSTRUMENTS) ×3 IMPLANT
SEAL UNIV 5-12 XI (MISCELLANEOUS) ×9 IMPLANT
SET BASIN LINEN APH (SET/KITS/TRAYS/PACK) ×3 IMPLANT
SET TUBE SMOKE EVAC HIGH FLOW (TUBING) IMPLANT
SOL PREP POV-IOD 4OZ 10% (MISCELLANEOUS) ×3 IMPLANT
SUT MNCRL AB 4-0 PS2 18 (SUTURE) ×3 IMPLANT
SUT STRATA 3-0 SH (SUTURE) ×6 IMPLANT
SUT STRATAFIX 0 PDS+ CT-2 23 (SUTURE) ×3 IMPLANT
SUT V-LOC 90 ABS 3-0 VLT V-20 (SUTURE) IMPLANT
SUT VIC AB 2-0 SH 27X BRD (SUTURE) IMPLANT
SUTURE STRATFX 0 PDS+ CT-2 23 (SUTURE) IMPLANT
SYR 30ML LL (SYRINGE) ×3 IMPLANT
TAPE TRANSPORE STRL 2 31045 (GAUZE/BANDAGES/DRESSINGS) ×3 IMPLANT
TRAY FOL W/BAG SLVR 16FR STRL (SET/KITS/TRAYS/PACK) ×3 IMPLANT
WATER STERILE IRR 500ML POUR (IV SOLUTION) ×3 IMPLANT

## 2024-01-30 NOTE — Interval H&P Note (Signed)
 History and Physical Interval Note:  01/30/2024 7:27 AM  Kim Richardson  has presented today for surgery, with the diagnosis of INGUINAL HERNIA, RIGHT.  The various methods of treatment have been discussed with the patient and family. After consideration of risks, benefits and other options for treatment, the patient has consented to  Procedure(s): REPAIR, HERNIA, INGUINAL, ROBOT-ASSISTED, LAPAROSCOPIC, USING MESH (Right) as a surgical intervention.  The patient's history has been reviewed, patient examined, no change in status, stable for surgery.  I have reviewed the patient's chart and labs.  Questions were answered to the patient's satisfaction.     Hakop Humbarger A Gleen Ripberger

## 2024-01-30 NOTE — Progress Notes (Signed)
 Patient c/o mid sternal/chest pain states it feels like its gas, notified Dr. Robyne Peers awaiting new orders at this time. Patient ambulated 300 feet in hall way with FWW to try and alleviate the pain , given PRN oxycodone PO , patient ate 100% of dinner . Call bell within reach ,

## 2024-01-30 NOTE — Transfer of Care (Signed)
 Immediate Anesthesia Transfer of Care Note  Patient: [name]  Procedure(s) Performed: REPAIR, HERNIA, INGUINAL, ROBOT-ASSISTED, LAPAROSCOPIC, USING MESH, LYSIS OF ADHESIONS, UMBILICAL AND SUPRAUMBILICAL HERNIA REPAIR (Right: Abdomen)  Patient Location: PACU  Anesthesia Type:General  Level of Consciousness: awake  Airway & Oxygen Therapy: Patient Spontanous Breathing and Patient connected to face mask oxygen  Post-op Assessment: Report given to RN and Post -op Vital signs reviewed and stable  Post vital signs: Reviewed and stable  Last Vitals:  Vitals Value Taken Time  BP 173/91   Temp 97.7   Pulse 69 01/30/24 1040  Resp 16   SpO2 100 % 01/30/24 1040  Vitals shown include unfiled device data.  Last Pain:  Vitals:   01/30/24 0628  TempSrc: Oral  PainSc: 0-No pain      Patients Stated Pain Goal: 5 (01/30/24 1610)  Complications: No notable events documented.

## 2024-01-30 NOTE — Progress Notes (Signed)
 [facility]  Spoke with the patient's daughter in the consultation room.  I explained that she tolerated the procedure without difficulty.  I explained that she had some adhesions to her umbilical hernia that I needed to take down for repair of her right inguinal hernia.  For this reason, I decided to also repair the umbilical hernia at the same time.  We also discussed that she had a right groin cord lipoma, which I removed.  She has dissolvable stitches under the skin with overlying skin glue.  This will flake off in 10 to 14 days.  She will be admitted overnight for observation, as she does not have anyone who can be with her tonight.  She will be discharged home tomorrow with a prescription for narcotic pain medication that they should take as needed for pain.  I also want her taking scheduled Tylenol.  If they take the narcotic pain medication, they should take a stool softener as well.  The patient will follow-up with me in 2 weeks.  All questions were answered to her expressed satisfaction.  Theophilus Kinds, DO [facility] [address] [place], [place] [address] [phone] (office)

## 2024-01-30 NOTE — Discharge Instructions (Addendum)
 Ambulatory Surgery Discharge Instructions  General Anesthesia or Sedation Do not drive or operate heavy machinery for 24 hours.  Do not consume alcohol, tranquilizers, sleeping medications, or any non-prescribed medications for 24 hours. Do not make important decisions or sign any important papers in the next 24 hours. You should have someone with you tonight at home.  Activity  You are advised to go directly home from the hospital.  Restrict your activities and rest for a day.  Resume light activity tomorrow. No heavy lifting over 10 lbs or strenuous exercise. Wear your abdominal binder at all times except while bathing and sleeping for the next 1-2 weeks.  Fluids and Diet Regular diet  Medications  If you have not had a bowel movement in 24 hours, take 2 tablespoons over the counter Milk of mag.             You May resume your blood thinners tomorrow (Aspirin, coumadin, or other).  You are being discharged with prescriptions for Opioid/Narcotic Medications: There are some specific considerations for these medications that you should know. Opioid Meds have risks & benefits. Addiction to these meds is always a concern with prolonged use Take medication only as directed Do not drive while taking narcotic pain medication Do not crush tablets or capsules Do not use a different container than medication was dispensed in Lock the container of medication in a cool, dry place out of reach of children and pets. Opioid medication can cause addiction Do not share with anyone else (this is a felony) Do not store medications for future use. Dispose of them properly.     Disposal:  Find a [employer] household drug take back site near you.  If you can't get to a drug take back site, use the recipe below as a last resort to dispose of expired, unused or unwanted drugs. Disposal  (Do not dispose chemotherapy drugs this way, talk to your prescribing doctor instead.) Step 1: Mix drugs (do not crush)  with dirt, kitty litter, or used coffee grounds and add a small amount of water to dissolve any solid medications. Step 2: Seal drugs in plastic bag. Step 3: Place plastic bag in trash. Step 4: Take prescription container and scratch out personal information, then recycle or throw away.  Operative Site  You have a liquid bandage over your incisions, this will begin to flake off in about a week. Ok to English as a second language teacher. Keep wound clean and dry. No baths or swimming. No lifting more than 10 pounds.  Contact Information: If you have questions or concerns, please call our office, [phone], Monday- Thursday 8AM-5PM and Friday 8AM-12Noon.  If it is after hours or on the weekend, please call [facility] Main [facility], [phone], and ask to speak to the surgeon on call for Dr. Robyne Peers at [facility].   SPECIFIC COMPLICATIONS TO WATCH FOR: Inability to urinate Fever over 101? F by mouth Nausea and vomiting lasting longer than 24 hours. Pain not relieved by medication ordered Swelling around the operative site Increased redness, warmth, hardness, around operative area Numbness, tingling, or cold fingers or toes Blood -soaked dressing, (small amounts of oozing may be normal) Increasing and progressive drainage from surgical area or exam site

## 2024-01-30 NOTE — Plan of Care (Signed)

## 2024-01-30 NOTE — Anesthesia Procedure Notes (Signed)
 Procedure Name: Intubation Date/Time: 01/30/2024 7:48 AM  Performed by: Julian Reil, CRNAPre-anesthesia Checklist: Patient identified, Emergency Drugs available, Suction available and Patient being monitored Patient Re-evaluated:Patient Re-evaluated prior to induction Oxygen Delivery Method: Circle system utilized Preoxygenation: Pre-oxygenation with 100% oxygen Induction Type: IV induction Ventilation: Mask ventilation without difficulty and Oral airway inserted - appropriate to patient size Laryngoscope Size: Glidescope and 3 Grade View: Grade I Tube type: Oral Tube size: 7.0 mm Number of attempts: 2 Airway Equipment and Method: Oral airway, Video-laryngoscopy and Rigid stylet Placement Confirmation: ETT inserted through vocal cords under direct vision, positive ETCO2 and breath sounds checked- equal and bilateral Secured at: 23 cm Tube secured with: Tape Dental Injury: Teeth and Oropharynx as per pre-operative assessment  Comments: 1st DL Mac 4, grade 4 view, large floppy epiglottis. 2nd DL with Glidescope as noted above.

## 2024-01-30 NOTE — Op Note (Signed)
 [facility] Operative Note  01/30/24  Preoperative Diagnosis: Right inguinal Hernia   Postoperative Diagnosis: Right inguinal hernia with cord lipoma, umbilical hernia (1 cm defect), supraumbilical hernia (1 cm defect), intra-abdominal adhesions   Procedure(s) Performed: Robotic assisted laparoscopic right inguinal hernia repair with mesh, umbilical and supraumbilical hernia repairs with mesh (total defect size 2 cm), lysis of adhesions   Surgeon: Theophilus Kinds, DO   Assistants: No qualified resident was available    Anesthesia: General endotracheal   Anesthesiologist: Dr. Johnnette Litter   Specimens: None   Estimated Blood Loss: Minimal   Blood Replacement: None    Complications: None   Wound Class:Clean   Operative Indications: The patient has a right inguinal hernia that is symptomatic and they want repaired. Patient also has a known umbilical hernia repair and we discussed the possibility of an umbilical hernia repair at the time of surgery.  We discussed robotic assisted laparoscopic inguinal hernia repair and risk of bleeding, infection, issues with chronic pain post operatively, use of mesh, risk of recurrence, chance of needing to repair a bilateral hernia, risk of injury to bowel or bladder, and risk of injury to cord structures for female patients.   Findings: Round ligament divided Progrip Laparoscopic Mesh for repair of right inguinal hernia Intra-abdominal adhesions to midline and right groin Umbilical and supraumbilical hernia defects (each 1 cm in size, total of 2 cm) Tension free repair achieved with Ventralight 4.5 inch circle mesh and suture  Hemostasis achieved   Procedure: The patient was taken to the operating room and placed supine. General endotracheal anesthesia was induced. Intravenous antibiotics were administered per protocol.  A foley catheter was placed and a orogastric tube positioned to decompress the stomach.  The abdomen was  prepared and draped in the usual sterile fashion.  A time-out was completed verifying correct patient, procedure, site, positioning, and implant(s) and/or special equipment prior to beginning this procedure.  An incision was marked 20 cm above the pubic tubercle, slightly above the umbilicus. Veress needle inserted at the supraumbilical site.  Saline drop test noted to be positive with gradual increase in pressure after initiation of gas insufflation.  15 mm of pressure was achieved prior to removing the Veress needle and then placing a 8 mm port via the Optiview technique through the supraumbilical site that had been previously marked.  Inspection of the area afterwards noted no injury to the surrounding organs during insertion of the needle and the port.  2 port sites were marked 8 cm to the lateral sides of the initial port, and a 8 mm robotic port was placed on the left side, another 8 mm robotic port on the right side under direct supervision. The [employer] platform was then brought into the operative field and docked to the ports.  Examination of the abdominal cavity noted adhesions to the midline.  These adhesions were taken down with a combination of blunt dissection, sharp dissection, and electrocautery.  Once the adhesions were down, the patient was noted to have a 1 cm umbilical hernia.  Attention was then turned to the right groin where adhesions were present to the abdominal wall.  These adhesions were partially taken down, taking care not to violate the peritoneum.  A small right inguinal hernia was noted.  A peritoneal flap was created approximately 8 cm cephalad to the defect by using scissors with electrocautery.  Dissection was carried down towards the pubic tubercle, developing the myopectineal orifice view. Laterally the flap was carried  towards the ASIS.  An indirect hernia sac was noted, which carefully dissected away from the adjacent tissues to be fully reduced out of hernia cavity.   The patient was noted to have a large cord lipoma which was dissected from the surrounding structures and removed.  Any bleeding was controlled with combination of electrocautery and manual pressure.  The round ligament was divided.  After confirming adequate dissection and the peritoneal reflection completely down and away from the divided round ligament at the deep ring, a Progrip laparoscopic mesh was placed within the anterior abdominal wall.  After noting proper placement of the mesh with the peritoneal reflection deep to it, the previously created peritoneal flap was secured back up to the anterior abdominal wall using running 3-0 V-Loc.   Attention was then turned back to the abdominal wall at the umbilicus.  The umbilical hernia contents noted and reduced with combination of blunt, sharp dissection with scissors and fenestrated forceps.  Hemostasis achieved throughout this portion.  Once all hernia contents reduced, there was noted to be a 1 cm umbilical hernia and a 1 cm supraumbilical hernia defect.  Any fat overlying the peritoneal layer was taken down creating flaps to clear the space for adequate fixation of the mesh.   The Ventralight 4.5 inch circle mesh, 0 Stratafix suture, and 2 V-Loc sutures were placed in the abdominal cavity under direct visualization.  A transfacial suture with 0 stratafix used to primarily close defect under minimal tension. The stratafix was then passed through the center of the mesh (coated side out) to secured the mesh to the abdominal wall centered over the defect.  The mesh was then circumferentially sutured into the anterior abdominal wall using 3-0 V-Loc x3.  Any bleeding noted during this portion was no longer actively bleeding by end of securing mesh and tightening the suture.  All needles were then removed out of the abdominal cavity, Xi platform undocked from the ports and removed off of operative field.  Marcaine was instilled at the incision sites.   The  abdomen was then desufflated and ports removed. All skin incisions were closed with a subcuticular stitch of Monocryl 4-0. Dermabond was applied.   Final inspection revealed acceptable hemostasis. All counts were correct at the end of the case. The patient was awakened from anesthesia and extubated without complication.  The patient went to the PACU in stable condition.   Theophilus Kinds, DO [facility] [address] [place], [place] [address] [phone] (office)

## 2024-01-30 NOTE — Anesthesia Preprocedure Evaluation (Addendum)
 Anesthesia Evaluation  Patient identified by MRN, date of birth, ID band Patient awake    Reviewed: Allergy & Precautions, H&P , NPO status , Patient's Chart, lab work & pertinent test results, reviewed documented beta blocker date and time   History of Anesthesia Complications (+) PONV and history of anesthetic complications  Airway Mallampati: II  TM Distance: >3 FB Neck ROM: full    Dental  (+) Dental Advisory Given, Missing,    Pulmonary Current Smoker   Pulmonary exam normal breath sounds clear to auscultation       Cardiovascular Exercise Tolerance: Good hypertension,  Rhythm:regular Rate:Normal     Neuro/Psych negative neurological ROS  negative psych ROS   GI/Hepatic negative GI ROS, Neg liver ROS,,,  Endo/Other  negative endocrine ROS    Renal/GU negative Renal ROS  negative genitourinary   Musculoskeletal   Abdominal   Peds  Hematology negative hematology ROS (+)   Anesthesia Other Findings   Reproductive/Obstetrics negative OB ROS                             Anesthesia Physical Anesthesia Plan  ASA: 2  Anesthesia Plan: General and General ETT   Post-op Pain Management:    Induction:   PONV Risk Score and Plan: Ondansetron  Airway Management Planned:   Additional Equipment:   Intra-op Plan:   Post-operative Plan:   Informed Consent: I have reviewed the patients History and Physical, chart, labs and discussed the procedure including the risks, benefits and alternatives for the proposed anesthesia with the patient or authorized representative who has indicated his/her understanding and acceptance.     Dental Advisory Given  Plan Discussed with: CRNA  Anesthesia Plan Comments:        Anesthesia Quick Evaluation

## 2024-01-30 NOTE — Progress Notes (Signed)
 Patient able to walk to restroom with one assist, she did void in the toilet. Patient does not have catheter so removed from LDA. She is alert and oriented , pain controlled thus far with PO oxycodone . Call bell within reach

## 2024-01-31 ENCOUNTER — Encounter (HOSPITAL_COMMUNITY): Payer: Self-pay | Admitting: Surgery

## 2024-01-31 DIAGNOSIS — K4090 Unilateral inguinal hernia, without obstruction or gangrene, not specified as recurrent: Secondary | ICD-10-CM | POA: Diagnosis not present

## 2024-01-31 LAB — HIV ANTIBODY (ROUTINE TESTING W REFLEX): HIV Screen 4th Generation wRfx: NONREACTIVE

## 2024-01-31 NOTE — Plan of Care (Signed)
   Problem: Education: Goal: Knowledge of General Education information will improve Description Including pain rating scale, medication(s)/side effects and non-pharmacologic comfort measures Outcome: Progressing   Problem: Health Behavior/Discharge Planning: Goal: Ability to manage health-related needs will improve Outcome: Progressing

## 2024-01-31 NOTE — Discharge Summary (Signed)
 Physician Discharge Summary  Patient ID: Kim Richardson MRN: [id number] DOB/AGE: 64-Jan-1962 41 y.o.  Admit date: 01/30/2024 Discharge date: 01/31/2024  Admission Diagnoses: S/P right inguinal hernia repair and umbilical hernia repair  Discharge Diagnoses:  Principal Problem:   S/P right inguinal hernia repair Active Problems:   Right inguinal hernia   Intra-abdominal adhesions   Umbilical hernia without obstruction and without gangrene   Discharged Condition: stable  Hospital Course: Patient is a 63 year old female who was admitted status post robotic assisted laparoscopic right inguinal hernia repair with mesh, umbilical hernia repair with mesh, and lysis of adhesions on 3/11.  She was admitted as she had no one who could stay with her after the anesthesia.  Postoperatively, she was able to tolerate a diet without nausea and vomiting, her pain is controlled with oral medications, and she has been able to ambulate around the floors.  She is stable for discharge at this time.  She will be discharged home with prescriptions for oxycodone, Zofran, Colace, and Tylenol.  She will follow-up with me on 4/2.  Consults: None  Significant Diagnostic Studies: None  Treatments: surgery: Robotic assisted laparoscopic right inguinal hernia repair with mesh, umbilical hernia repair with mesh, and lysis of adhesions  Discharge Exam: Blood pressure (!) 142/74, pulse (!) 58, temperature 99 F (37.2 C), temperature source Oral, resp. rate 18, height 5\' 4"  (1.626 m), weight 75.3 kg, SpO2 96%. General appearance: alert, cooperative, and no distress GI: Abdomen soft, nondistended, no percussion tenderness, nontender to palpation; no rigidity, guarding, rebound tenderness; laparoscopic incision sites C/D/I with Dermabond in place  Disposition: Discharge disposition: 01-Home or Self Care       Discharge Instructions     Call MD for:  persistant nausea and vomiting   Complete by: As directed     Call MD for:  redness, tenderness, or signs of infection (pain, swelling, redness, odor or green/yellow discharge around incision site)   Complete by: As directed    Call MD for:  severe uncontrolled pain   Complete by: As directed    Call MD for:  temperature >100.4   Complete by: As directed    Diet - low sodium heart healthy   Complete by: As directed    Increase activity slowly   Complete by: As directed       Allergies as of 01/31/2024   No Known Allergies      Medication List     TAKE these medications    acetaminophen 500 MG tablet Commonly known as: TYLENOL Take 2 tablets (1,000 mg total) by mouth every 6 (six) hours for 7 days.   aspirin EC 81 MG tablet Take 1 tablet (81 mg total) by mouth daily. Start taking on: February 02, 2024 What changed:  how much to take when to take this These instructions start on February 02, 2024. If you are unsure what to do until then, ask your doctor or other care provider. Another medication with the same name was removed. Continue taking this medication, and follow the directions you see here.   atorvastatin 40 MG tablet Commonly known as: LIPITOR Take 40 mg by mouth at bedtime. What changed: Another medication with the same name was removed. Continue taking this medication, and follow the directions you see here.   docusate sodium 100 MG capsule Commonly known as: Colace Take 1 capsule (100 mg total) by mouth 2 (two) times daily.   losartan 50 MG tablet Commonly known as: COZAAR Take 50 mg by  mouth daily.   ondansetron 4 MG tablet Commonly known as: Zofran Take 1 tablet (4 mg total) by mouth daily as needed for nausea or vomiting.   oxyCODONE 5 MG immediate release tablet Commonly known as: Roxicodone Take 1 tablet (5 mg total) by mouth every 6 (six) hours as needed.        Follow-up Information     Gwenivere Hiraldo, Gustavus Messing, DO. Go on 02/21/2024.   Specialty: General Surgery Why: Go to your appointment on 4/2 at  1:30PM Contact information: [address] [place] [facility] [address] [phone]                 Signed: Santina Evans A Tayleigh Wetherell 01/31/2024, 8:03 AM

## 2024-02-02 NOTE — Anesthesia Postprocedure Evaluation (Signed)
 Anesthesia Post Note  Patient: [name]  Procedure(s) Performed: REPAIR, HERNIA, INGUINAL, ROBOT-ASSISTED, LAPAROSCOPIC WITH MESH (Right: Abdomen) REPAIR, HERNIA, UMBILICAL, ROBOT-ASSISTED WITH MESH and supraumbilical (Abdomen) LYSIS, ADHESIONS, ROBOT-ASSISTED, LAPAROSCOPIC (Abdomen)  Patient location during evaluation: Phase II Anesthesia Type: General Level of consciousness: awake Pain management: pain level controlled Vital Signs Assessment: post-procedure vital signs reviewed and stable Respiratory status: spontaneous breathing and respiratory function stable Cardiovascular status: blood pressure returned to baseline and stable Postop Assessment: no headache and no apparent nausea or vomiting Anesthetic complications: no Comments: Late entry   No notable events documented.   Last Vitals:  Vitals:   01/31/24 0800 01/31/24 0832  BP: (!) 166/81 (!) 166/81  Pulse: (!) 57 (!) 57  Resp: (!) 28 (!) 21  Temp: 37.1 C 37.1 C  SpO2: 97% 97%    Last Pain:  Vitals:   01/31/24 1014  TempSrc:   PainSc: 3                  Kim Richardson

## 2024-02-21 ENCOUNTER — Ambulatory Visit (INDEPENDENT_AMBULATORY_CARE_PROVIDER_SITE_OTHER): Admitting: Surgery

## 2024-02-21 DIAGNOSIS — Z09 Encounter for follow-up examination after completed treatment for conditions other than malignant neoplasm: Secondary | ICD-10-CM

## 2024-02-21 NOTE — Progress Notes (Signed)
 [facility]  I am calling the patient for post operative evaluation. This is not a billable encounter as it is under the global charges for the surgery.  The patient had a robotic assisted laparoscopic right inguinal hernia repair with mesh, umbilical and supraumbilical hernia repair with mesh, and lysis of adhesions on 3/11. The patient reports that she is doing well, though she is sore.  States that she is sore in her right groin, but it is improved from the pain she was experiencing preoperatively.  She also is complaining of pain under her right breast above her right sided incision site after she caught her grandson when he was falling.  States that the pain has been about the same.  The only thing that makes the pain better is putting pressure on the area.  I discussed with the patient that without being able to evaluate exactly where her pain is at, it is difficult for me to state whether this pain is secondary to a pulled muscle versus an issue at her incision site.  Advised that she should take Tylenol as needed for pain, and she can put ice on the area to see if this improves her pain.  She is tolerating a diet.  She states that she has been more constipated recently and is requiring stool softener and a laxative to have bowel movements.  Patient states that she never picked up her oxycodone prescription and has not been taking any narcotic pain medications.  I advised that if she was not taking the narcotic pain medications, it is unlikely that her constipation is related to the surgery, and is more likely related to her diet.  Advised that she should increase water and fiber intake.  The incisions are healing well.  The skin clue has come off.  Patient is scheduled to see me next week, as she was unable to get a ride to her appointment this week.  I advised that at that time, I can more completely evaluate her right-sided pain.  Advised that she should present to the emergency  department if she has severe abdominal pain, nausea, vomiting, fever, chills, or obstipation  Will see the patient on 4/10.  Theophilus Kinds, DO [facility] [address] [place] [place], [place] [address] [phone] (office)

## 2024-02-29 ENCOUNTER — Encounter: Admitting: Surgery

## 2024-03-07 ENCOUNTER — Emergency Department (HOSPITAL_COMMUNITY)
Admission: EM | Admit: 2024-03-07 | Discharge: 2024-03-07 | Disposition: A | Discharge status: Home / Self Care | Attending: Emergency Medicine | Admitting: Emergency Medicine

## 2024-03-07 ENCOUNTER — Emergency Department (HOSPITAL_COMMUNITY)

## 2024-03-07 ENCOUNTER — Encounter (HOSPITAL_COMMUNITY): Payer: Self-pay | Admitting: *Deleted

## 2024-03-07 ENCOUNTER — Encounter: Admitting: Surgery

## 2024-03-07 ENCOUNTER — Other Ambulatory Visit: Payer: Self-pay

## 2024-03-07 DIAGNOSIS — R1084 Generalized abdominal pain: Secondary | ICD-10-CM | POA: Insufficient documentation

## 2024-03-07 DIAGNOSIS — E876 Hypokalemia: Secondary | ICD-10-CM | POA: Diagnosis not present

## 2024-03-07 DIAGNOSIS — Z7982 Long term (current) use of aspirin: Secondary | ICD-10-CM | POA: Diagnosis not present

## 2024-03-07 DIAGNOSIS — R7401 Elevation of levels of liver transaminase levels: Secondary | ICD-10-CM | POA: Insufficient documentation

## 2024-03-07 LAB — CBC WITH DIFFERENTIAL/PLATELET
Abs Immature Granulocytes: 0.01 10*3/uL (ref 0.00–0.07)
Basophils Absolute: 0 10*3/uL (ref 0.0–0.1)
Basophils Relative: 1 %
Eosinophils Absolute: 0.4 10*3/uL (ref 0.0–0.5)
Eosinophils Relative: 7 %
HCT: 37.7 % (ref 36.0–46.0)
Hemoglobin: 12.4 g/dL (ref 12.0–15.0)
Immature Granulocytes: 0 %
Lymphocytes Relative: 31 %
Lymphs Abs: 1.9 10*3/uL (ref 0.7–4.0)
MCH: 31.1 pg (ref 26.0–34.0)
MCHC: 32.9 g/dL (ref 30.0–36.0)
MCV: 94.5 fL (ref 80.0–100.0)
Monocytes Absolute: 0.5 10*3/uL (ref 0.1–1.0)
Monocytes Relative: 8 %
Neutro Abs: 3.2 10*3/uL (ref 1.7–7.7)
Neutrophils Relative %: 53 %
Platelets: 206 10*3/uL (ref 150–400)
RBC: 3.99 MIL/uL (ref 3.87–5.11)
RDW: 13.1 % (ref 11.5–15.5)
WBC: 6 10*3/uL (ref 4.0–10.5)
nRBC: 0 % (ref 0.0–0.2)

## 2024-03-07 LAB — COMPREHENSIVE METABOLIC PANEL WITH GFR
ALT: 145 U/L — ABNORMAL HIGH (ref 0–44)
AST: 162 U/L — ABNORMAL HIGH (ref 15–41)
Albumin: 3.2 g/dL — ABNORMAL LOW (ref 3.5–5.0)
Alkaline Phosphatase: 155 U/L — ABNORMAL HIGH (ref 38–126)
Anion gap: 7 (ref 5–15)
BUN: 17 mg/dL (ref 8–23)
CO2: 30 mmol/L (ref 22–32)
Calcium: 8.6 mg/dL — ABNORMAL LOW (ref 8.9–10.3)
Chloride: 102 mmol/L (ref 98–111)
Creatinine, Ser: 0.7 mg/dL (ref 0.44–1.00)
GFR, Estimated: >60 mL/min (ref >60)
Glucose, Bld: 130 mg/dL — ABNORMAL HIGH (ref 70–99)
Potassium: 2.9 mmol/L — ABNORMAL LOW (ref 3.5–5.1)
Sodium: 139 mmol/L (ref 135–145)
Total Bilirubin: 0.4 mg/dL (ref 0.0–1.2)
Total Protein: 7.3 g/dL (ref 6.5–8.1)

## 2024-03-07 LAB — LIPASE, BLOOD: Lipase: 34 U/L (ref 11–51)

## 2024-03-07 LAB — URINALYSIS, ROUTINE W REFLEX MICROSCOPIC
Bilirubin Urine: NEGATIVE
Glucose, UA: NEGATIVE mg/dL
Hgb urine dipstick: NEGATIVE
Ketones, ur: NEGATIVE mg/dL
Leukocytes,Ua: NEGATIVE
Nitrite: NEGATIVE
Protein, ur: NEGATIVE mg/dL
Specific Gravity, Urine: >1.046 — ABNORMAL HIGH (ref 1.005–1.030)
pH: 6 (ref 5.0–8.0)

## 2024-03-07 MED ORDER — IOHEXOL 300 MG/ML  SOLN
100.0000 mL | Freq: Once | INTRAMUSCULAR | Status: AC | PRN
  Administered 2024-03-07: 100 mL via INTRAVENOUS

## 2024-03-07 MED ORDER — POTASSIUM CHLORIDE CRYS ER 20 MEQ PO TBCR
40.0000 meq | EXTENDED_RELEASE_TABLET | Freq: Once | ORAL | Status: AC
  Administered 2024-03-07: 40 meq via ORAL
  Filled 2024-03-07: qty 2

## 2024-03-07 MED ORDER — POTASSIUM CHLORIDE CRYS ER 20 MEQ PO TBCR: 20.0000 meq | EXTENDED_RELEASE_TABLET | Freq: Two times a day (BID) | ORAL | 0 refills | Status: DC

## 2024-03-07 MED ORDER — TRAMADOL HCL 50 MG PO TABS: 50.0000 mg | ORAL_TABLET | Freq: Four times a day (QID) | ORAL | 0 refills | Status: DC | PRN

## 2024-03-07 NOTE — ED Notes (Signed)
 EKG completed. not exporting to epic, tried 3 times, says export successful but not showing in chart. printed EKG and given to zammit. [facility] notified.

## 2024-03-07 NOTE — Discharge Instructions (Signed)
 You have been prescribed a milder pain medication to help with your symptoms.  Keep your upcoming appointment with your surgeon for next week.  Return to the emergency department if you develop any new or worsening symptoms, fever or persistent vomiting

## 2024-03-07 NOTE — ED Triage Notes (Addendum)
 Pt c/o epigastric pain above her incision site, lower abdominal pain, and nausea x 1.5 weeks. Denies vomiting. Pt also c/o difficulty with constipation since having hernia surgery on March 11th.

## 2024-03-07 NOTE — ED Notes (Signed)
 Patient discharged. Provider spoke to patient. Paperwork given to patient and reviewed. Pt verbalized understanding. VSS. A+Ox4. Patient ambulated out of the ER with steady, independent gait. Iv removed intact without complications.

## 2024-03-07 NOTE — ED Provider Notes (Signed)
 [place] EMERGENCY DEPARTMENT AT [facility] Provider Note   CSN: [id number] Arrival date & time: 03/07/24  4098     History  Chief Complaint  Patient presents with   Abdominal Pain    Evone AZAYLAH STAILEY is a 63 y.o. female.   Abdominal Pain Associated symptoms: constipation and nausea   Associated symptoms: no chest pain, no chills, no cough, no dysuria, no fever, no shortness of breath and no vomiting       Ady P Nappier is a 63 y.o. female status post inguinal hernia repair mid March who presents to the Emergency Department complaining of diffuse, mainly upper abdominal pain for 2 weeks.  Pain has been associated with nausea, some early satiety and pain that worsens with food intake.  She reports having difficulty having a bowel movement since her surgery.  She is not taking any opiates.  She is having to take over-the-counter stool softeners and laxatives to have a bowel movement.  She denies fever, vomiting, chest pain or shortness of breath dysuria She had follow-up appointment with her surgeon scheduled for today, but appointment had to be rescheduled and patient was advised to come to the emergency department if she was having abdominal pain.    Home Medications Prior to Admission medications   Medication Sig Start Date End Date Taking? Authorizing Provider  aspirin  EC 81 MG tablet Take 1 tablet (81 mg total) by mouth daily. 02/02/24   Pappayliou, Bynum Cassis A, DO  atorvastatin  (LIPITOR) 40 MG tablet Take 40 mg by mouth at bedtime. Patient not taking: Reported on 01/30/2024 10/11/23   [provider]  docusate sodium  (COLACE) 100 MG capsule Take 1 capsule (100 mg total) by mouth 2 (two) times daily. 01/30/24 01/29/25  Pappayliou, Bynum Cassis A, DO  losartan  (COZAAR ) 50 MG tablet Take 50 mg by mouth daily.    [provider]  ondansetron  (ZOFRAN ) 4 MG tablet Take 1 tablet (4 mg total) by mouth daily as needed for nausea or vomiting. 01/30/24 01/29/25   Pappayliou, Bynum Cassis A, DO  oxyCODONE  (ROXICODONE ) 5 MG immediate release tablet Take 1 tablet (5 mg total) by mouth every 6 (six) hours as needed. 01/30/24   Pappayliou, Bynum Cassis A, DO      Allergies    Patient has no known allergies.    Review of Systems   Review of Systems  Constitutional:  Negative for chills and fever.  Respiratory:  Negative for cough and shortness of breath.   Cardiovascular:  Negative for chest pain.  Gastrointestinal:  Positive for abdominal pain, constipation and nausea. Negative for vomiting.  Genitourinary:  Negative for dysuria and flank pain.  Musculoskeletal:  Negative for back pain.  Skin:  Negative for wound.  Neurological:  Negative for weakness and headaches.    Physical Exam Updated Vital Signs BP (!) 156/70 (BP Location: Right Arm)   Pulse 70   Temp 98.2 F (36.8 C) (Oral)   Resp 16   Ht 5\' 3"  (1.6 m)   Wt 75.8 kg   SpO2 99%   BMI 29.58 kg/m  Physical Exam Vitals and nursing note reviewed.  Constitutional:      General: She is not in acute distress.    Appearance: She is well-developed. She is not ill-appearing or toxic-appearing.  HENT:     Mouth/Throat:     Mouth: Mucous membranes are moist.  Cardiovascular:     Rate and Rhythm: Normal rate and regular rhythm.     Pulses: Normal pulses.  Pulmonary:     Effort: Pulmonary effort is normal.  Chest:     Chest wall: No tenderness.  Abdominal:     Palpations: Abdomen is soft.     Tenderness: There is abdominal tenderness.     Comments: Diffuse tenderness to palpation of the abdomen.  Tenderness is slightly increased over the epigastric area.  Musculoskeletal:        General: Normal range of motion.  Skin:    General: Skin is warm.     Capillary Refill: Capillary refill takes less than 2 seconds.  Neurological:     General: No focal deficit present.     Mental Status: She is alert.     Sensory: No sensory deficit.     Motor: No weakness.    ED Results / Procedures /  Treatments   Labs (all labs ordered are listed, but only abnormal results are displayed) Labs Reviewed  COMPREHENSIVE METABOLIC PANEL WITH GFR - Abnormal; Notable for the following components:      Result Value   Potassium 2.9 (*)    Glucose, Bld 130 (*)    Calcium  8.6 (*)    Albumin 3.2 (*)    AST 162 (*)    ALT 145 (*)    Alkaline Phosphatase 155 (*)    All other components within normal limits  URINALYSIS, ROUTINE W REFLEX MICROSCOPIC - Abnormal; Notable for the following components:   APPearance HAZY (*)    Specific Gravity, Urine >1.046 (*)    All other components within normal limits  CBC WITH DIFFERENTIAL/PLATELET  LIPASE, BLOOD    EKG None  Radiology CT ABDOMEN PELVIS W CONTRAST Result Date: 03/07/2024 CLINICAL DATA:  Acute abdominal pain. Status post hernia repair in March. EXAM: CT ABDOMEN AND PELVIS WITH CONTRAST TECHNIQUE: Multidetector CT imaging of the abdomen and pelvis was performed using the standard protocol following bolus administration of intravenous contrast. RADIATION DOSE REDUCTION: This exam was performed according to the departmental dose-optimization program which includes automated exposure control, adjustment of the mA and/or kV according to patient size and/or use of iterative reconstruction technique. CONTRAST:  OMNIPAQUE  IOHEXOL  300 MG/ML  SOLN COMPARISON:  CT abdomen and pelvis 11/27/2023. FINDINGS: Lower chest: No acute abnormality. Hepatobiliary: No focal liver abnormality is seen. There is questionable nodular liver contour. No gallstones, gallbladder wall thickening, or biliary dilatation. Pancreas: Unremarkable. No pancreatic ductal dilatation or surrounding inflammatory changes. Spleen: Normal in size without focal abnormality. Adrenals/Urinary Tract: There is a 3.7 cm cyst in the right kidney. Otherwise, the kidneys, adrenal glands and bladder are within normal limits. Stomach/Bowel: Stomach is within normal limits. Appendix appears normal. No  evidence of bowel wall thickening, distention, or inflammatory changes. There is diffuse colonic diverticulosis. Vascular/Lymphatic: Aortic atherosclerosis. No enlarged abdominal or pelvic lymph nodes. Reproductive: Likely status post partial hysterectomy. The adnexa are within normal limits. Other: There is no ascites or free air. No focal abdominal wall hernia identified. There is a 1.5 by 2.3 by 4.6 cm fluid collection or in the intramuscular abdominal wall at the level of the umbilicus. There is no surrounding inflammation. This is new from prior. Postsurgical changes are seen with mash in the right inguinal region. Musculoskeletal: No acute or significant osseous findings. IMPRESSION: 1. No acute localizing process in the abdomen or pelvis. 2. New fluid collection in the intramuscular abdominal wall at the level of the umbilicus measuring up to 4.6 cm. Findings may represent a postoperative seroma or hematoma. Abscess not excluded in the appropriate  clinical setting. 3. Questionable nodular liver contour worrisome for cirrhosis. 4. Colonic diverticulosis. 5. Aortic atherosclerosis. Aortic Atherosclerosis (ICD10-I70.0). Electronically Signed   By: Tyron Gallon M.D.   On: 03/07/2024 14:48     Procedures Procedures    Medications Ordered in ED Medications - No data to display  ED Course/ Medical Decision Making/ A&P                                 Medical Decision Making  Patient status post 1 month inguinal hernia repair here with diffuse abdominal pain and problems with constipation for 2 weeks.  Has been taking stool softeners and over-the-counter laxatives in order to have a bowel movement.  Some nausea but no vomiting fever or chills.  No dysuria symptoms.  I suspect her symptoms are related to constipation, but given recent surgery her differential would also include complications with recent hernia repair, SBO, epigastric pain may be related to GERD, PUD  Amount and/or Complexity of  Data Reviewed Labs: ordered.    Details: Labs show elevated LFT's, unclear cause.  Also hypokalemia.  Does not take diuretic Radiology: ordered.    Details: CT abd pelvis shows fluid collection, nodular liver contour concerning for cirrhosis Discussion of management or test interpretation with external provider(s):   Surgery consulted and Patient also seen by Dr. Larrie Po while in the department.  Doubtful that this is an acute or infectious process.  Patient has upcoming appointment next week with Dr. Cherilyn Corn.  Will provide short course of tramadol  for pain relief.  Pt given K+ here and prescription.  Also discussed elevated LFT's.  Pt to closely f/u with PCP for f/u and recheck.  Possibly related to CT liver finding  Risk Prescription drug management.           Final Clinical Impression(s) / ED Diagnoses Final diagnoses:  Generalized abdominal pain  Hypokalemia    Rx / DC Orders ED Discharge Orders     None         Catherne Clubs, PA-C 03/08/24 2315    Cheyenne Cotta, MD 03/12/24 1049

## 2024-03-13 ENCOUNTER — Encounter: Admitting: Surgery

## 2024-03-21 ENCOUNTER — Encounter: Admitting: Surgery

## 2024-05-15 ENCOUNTER — Ambulatory Visit (INDEPENDENT_AMBULATORY_CARE_PROVIDER_SITE_OTHER): Admitting: Surgery

## 2024-05-15 ENCOUNTER — Encounter: Payer: Self-pay | Admitting: Surgery

## 2024-05-15 VITALS — BP 145/73 | HR 61 | Temp 98.0°F | Resp 14 | Ht 63.0 in | Wt 162.0 lb

## 2024-05-15 DIAGNOSIS — G8918 Other acute postprocedural pain: Secondary | ICD-10-CM | POA: Diagnosis not present

## 2024-05-15 DIAGNOSIS — R109 Unspecified abdominal pain: Secondary | ICD-10-CM

## 2024-05-15 NOTE — Patient Instructions (Addendum)
 Take tylenol  and ibuprofen as needed for pain control, alternating every 4-6 hours.   Example:  Tylenol  1000mg  @ 6am, 12noon, 6pm, (Do not exceed 4000mg  of tylenol  a day). Ibuprofen 600-800mg  @ 9am, 3pm, 9pm, 3am (Do not exceed 3600mg  of ibuprofen a day).   Use ice packs along painful areas of abdomen

## 2024-05-15 NOTE — Progress Notes (Signed)
 [facility] Note   HPI:  63 y.o. Female presents to clinic for who presents for persistent abdominal pain s/p robotic assisted laparoscopic right inguinal hernia repair with mesh, umbilical hernia and supraumbilical hernia repair with mesh, and lysis of adhesions on 3/11.  Patient presents for evaluation of continued upper abdominal pain.  She complains of worsening pain in the epigastric region and feels that she has a knot in this area.  It is slightly above where her midline laparoscopic incision site was.  She also states that she has upper abdominal pain in both her right and left upper quadrants.  She denies any nausea or vomiting.  In the pain seems to be constant and not associated with any thing specific.  Eating does not alter her pain in any way.  She has not taken any over-the-counter medications to assist with her pain.  She is having regular bowel movements.  She was seen in the emergency department 1 month postoperatively, and underwent a CT scan which demonstrated no acute localizing process, new fluid collection in the intramuscular abdominal wall at the level of the umbilicus potentially representing postoperative seroma versus hematoma.  Review of Systems:  All other review of systems: otherwise negative   Vital Signs:  BP (!) 145/73   Pulse 61   Temp 98 F (36.7 C) (Oral)   Resp 14   Ht 5' 3 (1.6 m)   Wt 162 lb (73.5 kg)   SpO2 97%   BMI 28.70 kg/m    Physical Exam:  Physical Exam Vitals reviewed.  Constitutional:      Appearance: Normal appearance.  Abdominal:     Comments: Abdomen soft, nondistended, no percussion tenderness, minimal epigastric tenderness to palpation; no rigidity, guarding, rebound tenderness; well-healed laparoscopic incision sites with no skin changes or palpable underlying abnormalities; no palpable nodules in her epigastric region   Neurological:     Mental Status: She is alert.     Laboratory studies: None   Imaging:   None   Assessment:  63 y.o. yo Female who presents for persistent abdominal pain s/p robotic assisted laparoscopic right inguinal hernia repair with mesh, umbilical hernia and supraumbilical hernia repair with mesh, and lysis of adhesions on 3/11.  Plan:  - I discussed with the patient that she potentially could have a muscle strain in her upper abdomen that is resulting in her pain.  She is far enough out from surgery, that she should have mostly healed related to her surgical sites - Advised that she should take Tylenol  and ibuprofen as needed for pain to see if this alleviates her pain - Advised that she can also place ice packs on her abdomen to see if this assists in her pain - I do not feel that additional imaging will be of benefit at this time, especially since she had a CT 1 month postoperatively - I will call the patient in 3 weeks to see if these measures have modified her pain at all.  We discussed that she may need to follow-up with GI or somebody else if she continues to have persistent abdominal pain without known cause  All of the above recommendations were discussed with the patient, and all of patient's questions were answered to their expressed satisfaction.  Note: Portions of this report may have been transcribed using voice recognition software. Every effort has been made to ensure accuracy; however, inadvertent computerized transcription errors may still be present.   Dorothyann Brittle, DO [facility] [phone]  [address] [place], [place] [address] [phone] (office)

## 2024-06-05 ENCOUNTER — Ambulatory Visit (INDEPENDENT_AMBULATORY_CARE_PROVIDER_SITE_OTHER): Admitting: Surgery

## 2024-06-05 DIAGNOSIS — R1013 Epigastric pain: Secondary | ICD-10-CM | POA: Diagnosis not present

## 2024-06-05 NOTE — Progress Notes (Unsigned)
 [facility]  I am calling the patient for post operative evaluation. This is not a billable encounter as it is under the global charges for the surgery.  The patient had a robotic assisted laparoscopic right inguinal hernia repair with mesh, umbilical hernia and supraumbilical hernia repair with mesh, and lysis of adhesions on 3/11 . ***The patient reports that *** is doing ***. The are tolerating a diet***, having good pain control***, and having regular Bms***.  The incisions are ***. The patient has *** concerns.   GI referral*** Epigastric pain, tylenol /advil slightly helped, but now not helping  Pathology: ***  Will see the patient PRN.   Dorothyann Brittle, DO [facility] [address] [place], [place] [address] [phone] (office)

## 2024-06-11 ENCOUNTER — Encounter (INDEPENDENT_AMBULATORY_CARE_PROVIDER_SITE_OTHER): Payer: Self-pay | Admitting: *Deleted

## 2024-06-25 ENCOUNTER — Encounter (INDEPENDENT_AMBULATORY_CARE_PROVIDER_SITE_OTHER): Payer: Self-pay | Admitting: Gastroenterology

## 2024-06-25 ENCOUNTER — Ambulatory Visit (INDEPENDENT_AMBULATORY_CARE_PROVIDER_SITE_OTHER): Admitting: Gastroenterology

## 2024-06-25 VITALS — BP 146/80 | HR 70 | Temp 98.0°F | Ht 63.0 in | Wt 164.8 lb

## 2024-06-25 DIAGNOSIS — K5900 Constipation, unspecified: Secondary | ICD-10-CM

## 2024-06-25 DIAGNOSIS — R1011 Right upper quadrant pain: Secondary | ICD-10-CM

## 2024-06-25 DIAGNOSIS — R7989 Other specified abnormal findings of blood chemistry: Secondary | ICD-10-CM | POA: Diagnosis not present

## 2024-06-25 DIAGNOSIS — K7460 Unspecified cirrhosis of liver: Secondary | ICD-10-CM | POA: Insufficient documentation

## 2024-06-25 DIAGNOSIS — R748 Abnormal levels of other serum enzymes: Secondary | ICD-10-CM | POA: Insufficient documentation

## 2024-06-25 DIAGNOSIS — R1013 Epigastric pain: Secondary | ICD-10-CM | POA: Diagnosis not present

## 2024-06-25 DIAGNOSIS — R1010 Upper abdominal pain, unspecified: Secondary | ICD-10-CM | POA: Insufficient documentation

## 2024-06-25 MED ORDER — POLYETHYLENE GLYCOL 3350 17 GM/SCOOP PO POWD
8.5000 g | Freq: Every day | ORAL | Status: DC
Start: 2024-06-25 — End: 2024-07-23

## 2024-06-25 NOTE — Progress Notes (Signed)
 Kim Richardson , M.D. Gastroenterology & Hepatology [facility] [address] [place], [place] [address] Primary Care Physician: Kim Burnard Helling, FNP [address] [place] [address]  Chief Complaint: Abdominal pain.  Elevated liver enzymes and CT finding concerning for cirrhosis  History of Present Illness: Kim Richardson is a 63 y.o. female, hypertension, coronary artery disease on aspirin  who presents for evaluation of abdominal pain,Elevated liver enzymes and CT finding concerning for cirrhosis  Patient reports that for the past 2 days she is having abdominal pain located in 2 spots when epigastric area and another spot and right upper quadrant.  Patient denies any aggravating reasons for the pain such as the character and intensity of the pain does not change with movement, food intake or defecation  She has occasional constipation with bowel movement every other day and takes Dulcolax daily  Patient seen by Kim Richardson for abdominal pain last month.  Patient had a CT performed before that in the ER suggesting new fluid collection in intramuscular abdominal wall at the level umbilicus and nodular contour of liver  Patient reports drinking beer twice a week.  Denies any herbal medication use  Last labs from 02/2024 AST 162 ALT 145 alk phos 155 T. bili 0.4 Hemoglobin 12.4  The patient denies having any nausea, vomiting, fever, chills, hematochezia, melena, hematemesis, abdominal distention, abdominal pain, diarrhea, jaundice, pruritus or weight loss.  Last ZHI:wnwz Last Colonoscopy:many years back at OSH as per patient and reports that she is due   FHx: neg for any gastrointestinal/liver disease, no malignancies Social: Octavio twice a week Surgical: Right inguinal hernia repair, hysterectomy  Past Medical History: Past Medical History:  Diagnosis Date   Cataract of both eyes    Hypertension     noncompliant 09/2011 due to no money   PONV (postoperative nausea and vomiting)    Trichomonas 2012   Rx'd flagyl  09/2011   Uterine fibroid     Past Surgical History: Past Surgical History:  Procedure Laterality Date   ABDOMINAL HYSTERECTOMY     CESAREAN SECTION  1983   [place], [place]   ROBOTIC ASSISTED LAPAROSCOPIC LYSIS OF ADHESION  01/30/2024   Procedure: LYSIS, ADHESIONS, ROBOT-ASSISTED, LAPAROSCOPIC;  Surgeon: Evonnie Dorothyann LABOR, DO;  Location: [facility];  Service: General;;   SUPRACERVICAL ABDOMINAL HYSTERECTOMY  01/10/2012   Procedure: HYSTERECTOMY SUPRACERVICAL ABDOMINAL;  Surgeon: Norleen LULLA Server, MD;  Location: [facility];  Service: Gynecology;  Laterality: N/A;  total abdominal hysterectomy with left oophorectomy   TUBAL LIGATION     at last cesarean   XI ROBOTIC ASSISTED INGUINAL HERNIA REPAIR WITH MESH Right 01/30/2024   Procedure: REPAIR, HERNIA, INGUINAL, ROBOT-ASSISTED, LAPAROSCOPIC WITH MESH;  Surgeon: Evonnie Dorothyann LABOR, DO;  Location: [facility];  Service: General;  Laterality: Right;    Family History: Family History  Problem Relation Age of Onset   Hypertension Mother    Heart failure Mother    Anesthesia problems Neg Hx    Hypotension Neg Hx    Malignant hyperthermia Neg Hx    Pseudochol deficiency Neg Hx     Social History: Social History   Tobacco Use  Smoking Status Every Day   Types: Cigarettes  Smokeless Tobacco Never  Tobacco Comments   1 cigarette daily    Social History   Substance and Sexual Activity  Alcohol Use Yes   Alcohol/week: 2.0 - 3.0 standard drinks of alcohol   Types: 2 - 3 Cans  of beer per week   Comment: daily    Social History   Substance and Sexual Activity  Drug Use No    Allergies: No Known Allergies  Medications: Current Outpatient Medications  Medication Sig Dispense Refill   aspirin  EC 81 MG tablet Take 1 tablet (81 mg total) by mouth daily.     atorvastatin  (LIPITOR) 40 MG tablet Take 40 mg by mouth at  bedtime.     losartan  (COZAAR ) 50 MG tablet Take 50 mg by mouth daily.     potassium chloride  SA (KLOR-CON  M) 20 MEQ tablet Take 1 tablet (20 mEq total) by mouth 2 (two) times daily. 10 tablet 0   No current facility-administered medications for this visit.    Review of Systems: GENERAL: negative for malaise, night sweats HEENT: No changes in hearing or vision, no nose bleeds or other nasal problems. NECK: Negative for lumps, goiter, pain and significant neck swelling RESPIRATORY: Negative for cough, wheezing CARDIOVASCULAR: Negative for chest pain, leg swelling, palpitations, orthopnea GI: SEE HPI MUSCULOSKELETAL: Negative for joint pain or swelling, back pain, and muscle pain. SKIN: Negative for lesions, rash HEMATOLOGY Negative for prolonged bleeding, bruising easily, and swollen nodes. ENDOCRINE: Negative for cold or heat intolerance, polyuria, polydipsia and goiter. NEURO: negative for tremor, gait imbalance, syncope and seizures. The remainder of the review of systems is noncontributory.   Physical Exam: BP (!) 146/80   Pulse 70   Temp 98 F (36.7 C)   Ht 5' 3 (1.6 m)   Wt 164 lb 12.8 oz (74.8 kg)   BMI 29.19 kg/m  GENERAL: The patient is AO x3, in no acute distress. HEENT: Head is normocephalic and atraumatic. EOMI are intact. Mouth is well hydrated and without lesions. NECK: Supple. No masses LUNGS: Clear to auscultation. No presence of rhonchi/wheezing/rales. Adequate chest expansion HEART: RRR, normal s1 and s2. ABDOMEN: Soft, nontender, no guarding, no peritoneal signs, and nondistended. BS +. No masses. Positive Carnet sign   Imaging/Labs: as above     Latest Ref Rng & Units 03/07/2024   11:41 AM 11/27/2023    8:58 AM 07/16/2017    1:14 PM  CBC  WBC 4.0 - 10.5 K/uL 6.0  5.8  6.6   Hemoglobin 12.0 - 15.0 g/dL 87.5  86.6  85.6   Hematocrit 36.0 - 46.0 % 37.7  40.2  41.5   Platelets 150 - 400 K/uL 206  205  237    No results found for: IRON, TIBC,  FERRITIN  I personally reviewed and interpreted the available labs, imaging and endoscopic files.   IMPRESSION: 1. No acute localizing process in the abdomen or pelvis. 2. New fluid collection in the intramuscular abdominal wall at the level of the umbilicus measuring up to 4.6 cm. Findings may represent a postoperative seroma or hematoma. Abscess not excluded in the appropriate clinical setting. 3. Questionable nodular liver contour worrisome for cirrhosis. 4. Colonic diverticulosis. 5. Aortic atherosclerosis.   Impression and Plan: Kim Richardson is a 63 y.o. female, hypertension, coronary artery disease on aspirin  who presents for evaluation of abdominal pain,Elevated liver enzymes and CT finding concerning for cirrhosis  #Abdominal pain #Intermittent constipation   Patient has localized abdominal pain at 2 different spots, one located epigastric area and another one located at RUQ .  Both of the area seem to be near previous incision site  On exam today patient does have positive Carnett's sign.  We may be dealing with abdominal wall pain , ACNES -anterior cutaneous  nerve entrapment syndrome  Patient does have a history of NSAID use (Goody powders) which she had stopped taking  I discussed with patient obtaining upper endoscopy to ensure we are not dealing with peptic ulcer disease  Also given intermittent constipation there could be component of IBS here   Proceed with upper endoscopy  Metamucil daily  IB guard   If above does not help patient abdominal pain will refer for trigger point injection in future  #Elevated liver enzymes  Last labs from 02/2024 AST 162 ALT 145 alk phos 155 T. bili 0.4 Recent imaging concerning for cirrhosis Platelets more than 150 and unlikely patient has any clinically significant portal hypertension  Will obtain baseline viral hepatitis profile and autoimmune serologies Complete abdominal ultrasound and elastography to assess for  degree of fibrosis or signs of portal hypertension  If advanced fibrosis is encountered we will enroll patient in [facility] screening protocol   #Colon cancer screening   The patient was counseled regarding the importance of colorectal cancer screening, The benefits of screening include early detection of colorectal cancer and precancerous polyps, which can improve treatment outcomes and reduce mortality. Risks associated with screening, particularly colonoscopy, include potential complications such as bleeding and perforation. After deciding different modalities for screening for colon cancer , patient has opted to pursue Colonoscopy   All questions were answered.      Cordella Nyquist Faizan Lashaun Poch, MD Gastroenterology and Hepatology [facility]   This chart has been completed using [employer] [employer] Dictation software, and while attempts have been made to ensure accuracy , certain words and phrases may not be transcribed as intended

## 2024-06-25 NOTE — Patient Instructions (Signed)
 It was very nice to meet you today, as dicussed with will plan for the following :  1) Ensure adequate fluid intake: Aim for 8 glasses of water  daily. Follow a high fiber diet: Include foods such as dates, prunes, pears, and kiwi. Take Miralax  twice a day for the first week, then reduce to once daily thereafter.  2) IB Guard  3) Upper endoscopy and colonoscopy

## 2024-07-01 ENCOUNTER — Ambulatory Visit (HOSPITAL_COMMUNITY)

## 2024-07-02 ENCOUNTER — Telehealth: Payer: Self-pay | Admitting: *Deleted

## 2024-07-02 ENCOUNTER — Encounter: Payer: Self-pay | Admitting: *Deleted

## 2024-07-02 ENCOUNTER — Other Ambulatory Visit: Payer: Self-pay | Admitting: *Deleted

## 2024-07-02 MED ORDER — PEG 3350-KCL-NA BICARB-NACL 420 G PO SOLR: 4000.0000 mL | Freq: Once | ORAL | 0 refills | Status: AC

## 2024-07-02 NOTE — Telephone Encounter (Signed)
 [facility]: Reference Number: [id number]

## 2024-07-04 ENCOUNTER — Ambulatory Visit (HOSPITAL_COMMUNITY)
Admission: RE | Admit: 2024-07-04 | Discharge: 2024-07-04 | Disposition: A | Discharge status: Home / Self Care | Source: Ambulatory Visit | Attending: Gastroenterology | Admitting: Gastroenterology

## 2024-07-04 DIAGNOSIS — K7460 Unspecified cirrhosis of liver: Secondary | ICD-10-CM | POA: Diagnosis present

## 2024-07-04 DIAGNOSIS — R748 Abnormal levels of other serum enzymes: Secondary | ICD-10-CM | POA: Insufficient documentation

## 2024-07-04 DIAGNOSIS — K760 Fatty (change of) liver, not elsewhere classified: Secondary | ICD-10-CM | POA: Insufficient documentation

## 2024-07-09 ENCOUNTER — Ambulatory Visit (INDEPENDENT_AMBULATORY_CARE_PROVIDER_SITE_OTHER): Payer: Self-pay | Admitting: Gastroenterology

## 2024-07-23 ENCOUNTER — Encounter: Payer: Self-pay | Admitting: Physician Assistant

## 2024-07-23 ENCOUNTER — Ambulatory Visit (INDEPENDENT_AMBULATORY_CARE_PROVIDER_SITE_OTHER): Admitting: Physician Assistant

## 2024-07-23 VITALS — BP 150/90 | HR 70 | Temp 97.3°F | Ht 63.0 in | Wt 164.0 lb

## 2024-07-23 DIAGNOSIS — E119 Type 2 diabetes mellitus without complications: Secondary | ICD-10-CM

## 2024-07-23 DIAGNOSIS — E782 Mixed hyperlipidemia: Secondary | ICD-10-CM

## 2024-07-23 DIAGNOSIS — Z1231 Encounter for screening mammogram for malignant neoplasm of breast: Secondary | ICD-10-CM

## 2024-07-23 DIAGNOSIS — Z7689 Persons encountering health services in other specified circumstances: Secondary | ICD-10-CM

## 2024-07-23 DIAGNOSIS — I10 Essential (primary) hypertension: Secondary | ICD-10-CM

## 2024-07-23 MED ORDER — LOSARTAN POTASSIUM 100 MG PO TABS: 100.0000 mg | ORAL_TABLET | Freq: Every day | ORAL | 1 refills | Status: AC

## 2024-07-23 NOTE — Assessment & Plan Note (Signed)
 Stable. Continue with current management without changes. Discussed healthy diet and lifestyle. Lipid panel today.

## 2024-07-23 NOTE — Progress Notes (Signed)
 New Patient Office Visit  Subjective    Patient ID: [name], female    DOB: [birth year]  Age: 63 y.o. MRN: [id number]  CC: No chief complaint on file.   HPI Kim Richardson presents to establish care  Discussed the use of AI scribe software for clinical note transcription with the patient, who gave verbal consent to proceed.  History of Present Illness Kim Richardson is a 63 year old female with hypertension who presents for management of high blood pressure.  Patient with past medical history significant for hypertension, hyperlipidemia, CAD, and distant history of type 2 DM. She manages her hypertension with losartan , however reports recent struggles to control blood pressure. She experiences occasional headaches, which she attributes to not eating breakfast, but denies, visual changes, shortness of breath, or chest pain. She follows up with a cardiologist every six months though [facility]. She has upcoming colonoscopy, and has had recent lab work for GI related symptoms. She does not have additional concerns or complaints today.     Outpatient Encounter Medications as of 07/23/2024  Medication Sig   aspirin  EC 81 MG tablet Take 1 tablet (81 mg total) by mouth daily.   atorvastatin  (LIPITOR) 80 MG tablet Take 80 mg by mouth daily.   losartan  (COZAAR ) 100 MG tablet Take 1 tablet (100 mg total) by mouth daily.   [DISCONTINUED] losartan  (COZAAR ) 50 MG tablet Take 50 mg by mouth daily.   [DISCONTINUED] atorvastatin  (LIPITOR) 40 MG tablet Take 40 mg by mouth at bedtime.   [DISCONTINUED] polyethylene glycol powder (GLYCOLAX /MIRALAX ) 17 GM/SCOOP powder Take 8.5 g by mouth daily.   [DISCONTINUED] potassium chloride  SA (KLOR-CON  M) 20 MEQ tablet Take 1 tablet (20 mEq total) by mouth 2 (two) times daily.   No facility-administered encounter medications on file as of 07/23/2024.    Past Medical History:  Diagnosis Date   Hypertension    noncompliant 09/2011 due to no money    PONV (postoperative nausea and vomiting)    Trichomonas 11/21/2010   Rx'd flagyl  09/2011   Uterine fibroid     Past Surgical History:  Procedure Laterality Date   ABDOMINAL HYSTERECTOMY     CESAREAN SECTION  [address] [place], [place]   ROBOTIC ASSISTED LAPAROSCOPIC LYSIS OF ADHESION  01/30/2024   Procedure: LYSIS, ADHESIONS, ROBOT-ASSISTED, LAPAROSCOPIC;  Surgeon: Evonnie Dorothyann LABOR, DO;  Location: [facility];  Service: General;;   SUPRACERVICAL ABDOMINAL HYSTERECTOMY  01/10/2012   Procedure: HYSTERECTOMY SUPRACERVICAL ABDOMINAL;  Surgeon: Norleen LULLA Server, MD;  Location: [facility];  Service: Gynecology;  Laterality: N/A;  total abdominal hysterectomy with left oophorectomy   TUBAL LIGATION     at last cesarean   XI ROBOTIC ASSISTED INGUINAL HERNIA REPAIR WITH MESH Right 01/30/2024   Procedure: REPAIR, HERNIA, INGUINAL, ROBOT-ASSISTED, LAPAROSCOPIC WITH MESH;  Surgeon: Evonnie Dorothyann LABOR, DO;  Location: [facility];  Service: General;  Laterality: Right;    Family History  Problem Relation Age of Onset   Hypertension Mother    Heart failure Mother    Anesthesia problems Neg Hx    Hypotension Neg Hx    Malignant hyperthermia Neg Hx    Pseudochol deficiency Neg Hx     Social History   Socioeconomic History   Marital status: Widowed    Spouse name: Not on file   Number of children: Not on file   Years of education: Not on file   Highest education level: Not on file  Occupational History  Not on file  Tobacco Use   Smoking status: Every Day    Current packs/day: 0.25    Average packs/day: 0.3 packs/day for 46.0 years (11.5 ttl pk-yrs)    Types: Cigarettes    Start date: 07/25/1978   Smokeless tobacco: Never   Tobacco comments:    1 cigarette daily   Vaping Use   Vaping status: Never Used  Substance and Sexual Activity   Alcohol use: Yes    Alcohol/week: 2.0 - 3.0 standard drinks of alcohol    Types: 2 - 3 Cans of beer per week    Comment: daily    Drug use: No   Sexual  activity: Not Currently  Other Topics Concern   Not on file  Social History Narrative   Not on file   Social Drivers of Health   Financial Resource Strain: Not on file  Food Insecurity: No Food Insecurity (01/30/2024)   Hunger Vital Sign    Worried About Running Out of Food in the Last Year: Never true    Ran Out of Food in the Last Year: Never true  Transportation Needs: No Transportation Needs (01/30/2024)   PRAPARE - Administrator, Civil Service (Medical): No    Lack of Transportation (Non-Medical): No  Physical Activity: Not on file  Stress: Not on file  Social Connections: Not on file  Intimate Partner Violence: Not At Risk (01/30/2024)   Humiliation, Afraid, Rape, and Kick questionnaire    Fear of Current or Ex-Partner: No    Emotionally Abused: No    Physically Abused: No    Sexually Abused: No    Review of Systems  Constitutional:  Negative for chills, fever and malaise/fatigue.  Eyes:  Negative for blurred vision and double vision.  Respiratory:  Negative for cough and shortness of breath.   Cardiovascular:  Negative for chest pain and palpitations.  Musculoskeletal:  Negative for joint pain and myalgias.  Neurological:  Positive for headaches. Negative for dizziness.        Objective    BP (!) 150/90   Pulse 70   Temp (!) 97.3 F (36.3 C)   Ht 5' 3 (1.6 m)   Wt 164 lb (74.4 kg)   SpO2 99%   BMI 29.05 kg/m   Physical Exam Constitutional:      Appearance: Normal appearance.  HENT:     Head: Normocephalic and atraumatic.     Mouth/Throat:     Mouth: Mucous membranes are moist.     Pharynx: Oropharynx is clear.  Eyes:     Extraocular Movements: Extraocular movements intact.     Conjunctiva/sclera: Conjunctivae normal.  Cardiovascular:     Rate and Rhythm: Normal rate and regular rhythm.     Heart sounds: Normal heart sounds. No murmur heard. Pulmonary:     Effort: Pulmonary effort is normal.     Breath sounds: Normal breath sounds.  No wheezing or rales.  Musculoskeletal:     Right lower leg: No edema.     Left lower leg: No edema.  Skin:    General: Skin is warm and dry.  Neurological:     General: No focal deficit present.     Mental Status: She is alert and oriented to person, place, and time.  Psychiatric:        Mood and Affect: Mood normal.        Behavior: Behavior normal.        Assessment & Plan:  Encounter to establish care  Hypertension, essential Assessment & Plan: 150/84, 150/90 Uncontrolled. Continue current medications, increase losartan  to 100 mg daily.  Discussed DASH diet and dietary sodium restrictions.  Increase dietary efforts and physical activity. Patient to follow up in 1 month. Warning signs such as new or frequent headaches, visual changes, chest pain, or shortness of breath discussed.   Orders: -     Lipid panel -     Losartan  Potassium; Take 1 tablet (100 mg total) by mouth daily.  Dispense: 90 tablet; Refill: 1  Diabetes mellitus type 2, noninsulin dependent (HCC) Assessment & Plan: Patient reports distant history of diabetes, previously on metformin. However, states sugars have been controlled and she has not been on medication for some time. A1c today to evaluate.   Orders: -     Hemoglobin A1c  Mixed hyperlipidemia Assessment & Plan: Stable. Continue with current management without changes. Discussed healthy diet and lifestyle. Lipid panel today.   Orders: -     Lipid panel  Encounter for screening mammogram for malignant neoplasm of breast -     3D Screening Mammogram, Left and Right    Return in about 4 weeks (around 08/20/2024) for BP.   Charmaine Tequia Wolman, PA-C

## 2024-07-23 NOTE — Assessment & Plan Note (Signed)
 150/84, 150/90 Uncontrolled. Continue current medications, increase losartan  to 100 mg daily.  Discussed DASH diet and dietary sodium restrictions.  Increase dietary efforts and physical activity. Patient to follow up in 1 month. Warning signs such as new or frequent headaches, visual changes, chest pain, or shortness of breath discussed.

## 2024-07-23 NOTE — Assessment & Plan Note (Signed)
 Patient reports distant history of diabetes, previously on metformin. However, states sugars have been controlled and she has not been on medication for some time. A1c today to evaluate.

## 2024-07-31 LAB — HEMOGLOBIN A1C
Est. average glucose Bld gHb Est-mCnc: 123 mg/dL
Hgb A1c MFr Bld: 5.9 % — ABNORMAL HIGH (ref 4.8–5.6)

## 2024-07-31 LAB — LIPID PANEL
Chol/HDL Ratio: 2.2 ratio (ref 0.0–4.4)
Cholesterol, Total: 148 mg/dL (ref 100–199)
HDL: 66 mg/dL (ref >39)
LDL Chol Calc (NIH): 67 mg/dL (ref 0–99)
Triglycerides: 80 mg/dL (ref 0–149)
VLDL Cholesterol Cal: 15 mg/dL (ref 5–40)

## 2024-08-01 ENCOUNTER — Ambulatory Visit: Payer: Self-pay | Admitting: Physician Assistant

## 2024-08-05 NOTE — Telephone Encounter (Signed)
Patient informed of results and recommendations

## 2024-08-05 NOTE — Telephone Encounter (Signed)
-----   Message from Morrison Grooms sent at 08/01/2024 10:13 AM EDT ----- A1c elevated at 5.9 into prediabetic range. Reasonable to manage with lifestyle changes such as healthy diet and increased exercise.  Cholesterol is within goal.  ----- Message ----- From: Interface, [facility] Lab Results In Sent: 07/31/2024   5:39 AM EDT To: Charmaine Grooms, PA-C

## 2024-08-16 ENCOUNTER — Encounter (HOSPITAL_COMMUNITY)
Admission: RE | Disposition: A | Discharge status: Home / Self Care | Payer: Self-pay | Source: Home / Self Care | Attending: Gastroenterology

## 2024-08-16 ENCOUNTER — Ambulatory Visit (HOSPITAL_COMMUNITY): Admitting: Certified Registered Nurse Anesthetist

## 2024-08-16 ENCOUNTER — Other Ambulatory Visit: Payer: Self-pay

## 2024-08-16 ENCOUNTER — Ambulatory Visit (HOSPITAL_COMMUNITY)
Admission: RE | Admit: 2024-08-16 | Discharge: 2024-08-16 | Disposition: A | Discharge status: Home / Self Care | Attending: Gastroenterology | Admitting: Gastroenterology

## 2024-08-16 ENCOUNTER — Ambulatory Visit (HOSPITAL_BASED_OUTPATIENT_CLINIC_OR_DEPARTMENT_OTHER): Admitting: Certified Registered Nurse Anesthetist

## 2024-08-16 ENCOUNTER — Encounter (HOSPITAL_COMMUNITY): Payer: Self-pay | Admitting: Gastroenterology

## 2024-08-16 DIAGNOSIS — K2950 Unspecified chronic gastritis without bleeding: Secondary | ICD-10-CM | POA: Diagnosis not present

## 2024-08-16 DIAGNOSIS — K648 Other hemorrhoids: Secondary | ICD-10-CM

## 2024-08-16 DIAGNOSIS — I2510 Atherosclerotic heart disease of native coronary artery without angina pectoris: Secondary | ICD-10-CM | POA: Diagnosis not present

## 2024-08-16 DIAGNOSIS — Z1211 Encounter for screening for malignant neoplasm of colon: Secondary | ICD-10-CM

## 2024-08-16 DIAGNOSIS — F17210 Nicotine dependence, cigarettes, uncomplicated: Secondary | ICD-10-CM | POA: Insufficient documentation

## 2024-08-16 DIAGNOSIS — Z8249 Family history of ischemic heart disease and other diseases of the circulatory system: Secondary | ICD-10-CM | POA: Insufficient documentation

## 2024-08-16 DIAGNOSIS — I10 Essential (primary) hypertension: Secondary | ICD-10-CM | POA: Insufficient documentation

## 2024-08-16 DIAGNOSIS — R1013 Epigastric pain: Secondary | ICD-10-CM | POA: Diagnosis present

## 2024-08-16 DIAGNOSIS — D125 Benign neoplasm of sigmoid colon: Secondary | ICD-10-CM | POA: Diagnosis not present

## 2024-08-16 DIAGNOSIS — K5730 Diverticulosis of large intestine without perforation or abscess without bleeding: Secondary | ICD-10-CM | POA: Diagnosis not present

## 2024-08-16 DIAGNOSIS — B9681 Helicobacter pylori [H. pylori] as the cause of diseases classified elsewhere: Secondary | ICD-10-CM | POA: Diagnosis not present

## 2024-08-16 DIAGNOSIS — D123 Benign neoplasm of transverse colon: Secondary | ICD-10-CM | POA: Diagnosis not present

## 2024-08-16 DIAGNOSIS — E119 Type 2 diabetes mellitus without complications: Secondary | ICD-10-CM | POA: Insufficient documentation

## 2024-08-16 DIAGNOSIS — K2970 Gastritis, unspecified, without bleeding: Secondary | ICD-10-CM

## 2024-08-16 HISTORY — PX: ESOPHAGOGASTRODUODENOSCOPY: SHX5428

## 2024-08-16 HISTORY — PX: COLONOSCOPY: SHX5424

## 2024-08-16 LAB — HM COLONOSCOPY

## 2024-08-16 SURGERY — COLONOSCOPY
Anesthesia: General

## 2024-08-16 MED ORDER — LIDOCAINE 2% (20 MG/ML) 5 ML SYRINGE
INTRAMUSCULAR | Status: DC | PRN
  Administered 2024-08-16: 60 mg via INTRAVENOUS

## 2024-08-16 MED ORDER — PROPOFOL 500 MG/50ML IV EMUL
INTRAVENOUS | Status: DC | PRN
  Administered 2024-08-16: 70 ug/kg/min via INTRAVENOUS

## 2024-08-16 MED ORDER — PROPOFOL 500 MG/50ML IV EMUL
INTRAVENOUS | Status: AC
  Filled 2024-08-16: qty 50

## 2024-08-16 MED ORDER — LACTATED RINGERS IV SOLN: INTRAVENOUS | Status: DC | PRN

## 2024-08-16 MED ORDER — LIDOCAINE 2% (20 MG/ML) 5 ML SYRINGE
INTRAMUSCULAR | Status: AC
  Filled 2024-08-16: qty 5

## 2024-08-16 NOTE — Op Note (Signed)
 [facility] Patient Name: Kim Richardson Procedure Date: 08/16/2024 8:31 AM MRN: [id number] Date of Birth: Mar 15, 1961 Attending MD: Deatrice Dine , MD, [id number] CSN: [id number] Age: 63 Admit Type: Outpatient Procedure:                Upper GI endoscopy Indications:              Epigastric abdominal pain Providers:                Deatrice Dine, MD, Leandrew Edelman RN, RN, Jon Loge Referring MD:              Medicines:                Monitored Anesthesia Care Complications:            No immediate complications. Estimated Blood Loss:     Estimated blood loss was minimal. Procedure:                Pre-Anesthesia Assessment:                           - Prior to the procedure, a History and Physical                            was performed, and patient medications and                            allergies were reviewed. The patient's tolerance of                            previous anesthesia was also reviewed. The risks                            and benefits of the procedure and the sedation                            options and risks were discussed with the patient.                            All questions were answered, and informed consent                            was obtained. Prior Anticoagulants: The patient has                            taken no anticoagulant or antiplatelet agents                            except for aspirin . ASA Grade Assessment: II - A                            patient with mild systemic disease. After reviewing  the risks and benefits, the patient was deemed in                            satisfactory condition to undergo the procedure.                           After obtaining informed consent, the endoscope was                            passed under direct vision. Throughout the                            procedure, the patient's blood pressure, pulse, and                             oxygen saturations were monitored continuously. The                            [id number] ([id number]) scope was introduced through                            the mouth, and advanced to the second part of                            duodenum. The upper GI endoscopy was accomplished                            without difficulty. The patient tolerated the                            procedure well. Scope In: 8:44:21 AM Scope Out: 8:48:30 AM Total Procedure Duration: 0 hours 4 minutes 9 seconds  Findings:      The examined esophagus was normal.      Mild inflammation characterized by erythema was found in the gastric       antrum. Biopsies were taken with a cold forceps for histology.      The duodenal bulb and second portion of the duodenum were normal. Impression:               - Normal esophagus.                           - Gastritis. Biopsied.                           - Normal duodenal bulb and second portion of the                            duodenum. Moderate Sedation:      Per Anesthesia Care Recommendation:           - Patient has a contact number available for                            emergencies. The signs and symptoms of potential  delayed complications were discussed with the                            patient. Return to normal activities tomorrow.                            Written discharge instructions were provided to the                            patient.                           - Resume previous diet.                           - Continue present medications.                           - Await pathology results. Procedure Code(s):        --- Professional ---                           [phone], Esophagogastroduodenoscopy, flexible,                            transoral; with biopsy, single or multiple Diagnosis Code(s):        --- Professional ---                           K29.70, Gastritis, unspecified, without bleeding                            R10.13, Epigastric pain CPT copyright 2022 American Medical Association. All rights reserved. The codes documented in this report are preliminary and upon coder review may  be revised to meet current compliance requirements. Deatrice Dine, MD Deatrice Dine, MD 08/16/2024 9:18:55 AM This report has been signed electronically. Number of Addenda: 0

## 2024-08-16 NOTE — Anesthesia Postprocedure Evaluation (Signed)
 Anesthesia Post Note  Patient: Kim Richardson  Procedure(s) Performed: COLONOSCOPY EGD (ESOPHAGOGASTRODUODENOSCOPY)  Patient location during evaluation: Phase II Anesthesia Type: General Level of consciousness: awake Pain management: pain level controlled Vital Signs Assessment: post-procedure vital signs reviewed and stable Respiratory status: spontaneous breathing and respiratory function stable Cardiovascular status: blood pressure returned to baseline and stable Postop Assessment: no headache and no apparent nausea or vomiting Anesthetic complications: no Comments: Late entry   No notable events documented.   Last Vitals:  Vitals:   08/16/24 0806 08/16/24 0915  BP: 133/86 124/64  Pulse: 70 82  Resp: (!) 22 (!) 28  Temp: 37.1 C 36.4 C  SpO2: 99% 95%    Last Pain:  Vitals:   08/16/24 0915  TempSrc: Oral  PainSc:                  Yvonna JINNY Bosworth

## 2024-08-16 NOTE — Progress Notes (Signed)
 Post op pt c/o lips hurting  Small non-bleeding cut noted middle of inner lower lip, possible pinch from bite block.  Dr. Cinderella and CRNA, Wyvonna Shams, assessed.  Vaseline given to pt to apply as needed. Pt verbalized understanding.

## 2024-08-16 NOTE — Discharge Instructions (Signed)
   Discharge instructions Please read the instructions outlined below and refer to this sheet in the next few weeks. These discharge instructions provide you with general information on caring for yourself after you leave the hospital. Your doctor may also give you specific instructions. While your treatment has been planned according to the most current medical practices available, unavoidable complications occasionally occur. If you have any problems or questions after discharge, please call your doctor. ACTIVITY You may resume your regular activity but move at a slower pace for the next 24 hours.  Take frequent rest periods for the next 24 hours.  Walking will help expel (get rid of) the air and reduce the bloated feeling in your abdomen.  No driving for 24 hours (because of the anesthesia (medicine) used during the test).  You may shower.  Do not sign any important legal documents or operate any machinery for 24 hours (because of the anesthesia used during the test).  NUTRITION Drink plenty of fluids.  You may resume your normal diet.  Begin with a light meal and progress to your normal diet.  Avoid alcoholic beverages for 24 hours or as instructed by your caregiver.  MEDICATIONS You may resume your normal medications unless your caregiver tells you otherwise.  WHAT YOU CAN EXPECT TODAY You may experience abdominal discomfort such as a feeling of fullness or "gas" pains.  FOLLOW-UP Your doctor will discuss the results of your test with you.  SEEK IMMEDIATE MEDICAL ATTENTION IF ANY OF THE FOLLOWING OCCUR: Excessive nausea (feeling sick to your stomach) and/or vomiting.  Severe abdominal pain and distention (swelling).  Trouble swallowing.  Temperature over 101 F (37.8 C).  Rectal bleeding or vomiting of blood.    Avoid using high dose aspirin including Goody/BC powders, NSAIDs such as Aleve, ibuprofen, naproxen, Motrin, Voltaren or Advil (even the topical ones)    I hope you have a  great rest of your week!   Kaceton Vieau Faizan Ayushi Pla , M.D.. Gastroenterology and Hepatology [facility]

## 2024-08-16 NOTE — Transfer of Care (Signed)
 Immediate Anesthesia Transfer of Care Note  Patient: Kim Richardson  Procedure(s) Performed: COLONOSCOPY EGD (ESOPHAGOGASTRODUODENOSCOPY)  Patient Location: Endoscopy Unit  Anesthesia Type:General  Level of Consciousness: awake  Airway & Oxygen Therapy: Patient Spontanous Breathing  Post-op Assessment: Report given to RN and Post -op Vital signs reviewed and stable  Post vital signs: Reviewed and stable  Last Vitals:  Vitals Value Taken Time  BP 124/64 08/16/24 09:15  Temp 36.4 C 08/16/24 09:15  Pulse 82 08/16/24 09:15  Resp 28 08/16/24 09:15  SpO2 95 % 08/16/24 09:15    Last Pain:  Vitals:   08/16/24 0915  TempSrc: Oral  PainSc:       Patients Stated Pain Goal: 4 (08/16/24 0806)  Complications: No notable events documented.

## 2024-08-16 NOTE — Op Note (Signed)
 [facility] Patient Name: Kim Richardson Procedure Date: 08/16/2024 8:30 AM MRN: [id number] Date of Birth: March 20, 1961 Attending MD: Deatrice Dine , MD, [id number] CSN: [id number] Age: 62 Admit Type: Outpatient Procedure:                Colonoscopy Indications:              Screening for colorectal malignant neoplasm Providers:                Deatrice Dine, MD, Leandrew Edelman RN, RN, Jon Loge Referring MD:              Medicines:                Monitored Anesthesia Care Complications:            No immediate complications. Estimated Blood Loss:     Estimated blood loss: none. Procedure:                Pre-Anesthesia Assessment:                           - Prior to the procedure, a History and Physical                            was performed, and patient medications and                            allergies were reviewed. The patient's tolerance of                            previous anesthesia was also reviewed. The risks                            and benefits of the procedure and the sedation                            options and risks were discussed with the patient.                            All questions were answered, and informed consent                            was obtained. Prior Anticoagulants: The patient has                            taken no anticoagulant or antiplatelet agents. ASA                            Grade Assessment: II - A patient with mild systemic                            disease. After reviewing the risks and benefits,  the patient was deemed in satisfactory condition to                            undergo the procedure.                           After obtaining informed consent, the colonoscope                            was passed under direct vision. Throughout the                            procedure, the patient's blood pressure, pulse, and                            oxygen  saturations were monitored continuously. The                            [id number] ([id number]) Peds Colon was introduced                            through the anus and advanced to the the cecum,                            identified by appendiceal orifice and ileocecal                            valve. The colonoscopy was performed without                            difficulty. The patient tolerated the procedure                            well. The quality of the bowel preparation was                            evaluated using the BBPS [facility] Bowel Preparation                            Scale) with scores of: Right Colon = 3, Transverse                            Colon = 3 and Left Colon = 3 (entire mucosa seen                            well with no residual staining, small fragments of                            stool or opaque liquid). The total BBPS score                            equals 9. The ileocecal valve, appendiceal orifice,  and rectum were photographed. Scope In: 8:53:28 AM Scope Out: 9:12:27 AM Scope Withdrawal Time: 0 hours 15 minutes 0 seconds  Total Procedure Duration: 0 hours 18 minutes 59 seconds  Findings:      A 5 mm polyp was found in the transverse colon. The polyp was sessile.       The polyp was removed with a cold snare. Resection and retrieval were       complete.      Scattered diverticula were found in the left colon.      A 15 mm polyp was found in the sigmoid colon. The polyp was       pedunculated. The polyp was removed with a hot snare. Resection and       retrieval were complete.      Non-bleeding internal hemorrhoids were found during retroflexion. The       hemorrhoids were small. Impression:               - One 5 mm polyp in the transverse colon, removed                            with a cold snare. Resected and retrieved.                           - Diverticulosis in the left colon.                           - One 15 mm  polyp in the sigmoid colon, removed                            with a hot snare. Resected and retrieved.                           - Non-bleeding internal hemorrhoids. Moderate Sedation:      Per Anesthesia Care Recommendation:           - Patient has a contact number available for                            emergencies. The signs and symptoms of potential                            delayed complications were discussed with the                            patient. Return to normal activities tomorrow.                            Written discharge instructions were provided to the                            patient.                           - Resume previous diet.                           - Continue present medications.                           -  Await pathology results.                           - Repeat colonoscopy in 3 years for surveillance                            based on pathology results.                           - Return to GI clinic as previously scheduled. Procedure Code(s):        --- Professional ---                           [phone], Colonoscopy, flexible; with removal of                            tumor(s), polyp(s), or other lesion(s) by snare                            technique Diagnosis Code(s):        --- Professional ---                           Z12.11, Encounter for screening for malignant                            neoplasm of colon                           D12.3, Benign neoplasm of transverse colon (hepatic                            flexure or splenic flexure)                           D12.5, Benign neoplasm of sigmoid colon                           K64.8, Other hemorrhoids                           K57.30, Diverticulosis of large intestine without                            perforation or abscess without bleeding CPT copyright 2022 American Medical Association. All rights reserved. The codes documented in this report are preliminary and upon coder review  may  be revised to meet current compliance requirements. Deatrice Dine, MD Deatrice Dine, MD 08/16/2024 9:21:34 AM This report has been signed electronically. Number of Addenda: 0

## 2024-08-16 NOTE — H&P (Signed)
 Primary Care Physician:  Grooms, [place], [place] Primary Gastroenterologist:  Dr. Cinderella  Pre-Procedure History & Physical: HPI: Kim Richardson is a 63 y.o. female, hypertension, coronary artery disease on aspirin  who presents for evaluation of abdominal pain and screening colonoscopy   abdominal pain located in 2 spots when epigastric area and another spot and right upper quadrant.  Patient denies any aggravating reasons for the pain such as the character and intensity of the pain does not change with movement, food intake or defecation   She has occasional constipation with bowel movement every other day and takes Dulcolax daily   Patient seen by Dr.Pappayliou for abdominal pain last month.  Patient had a CT performed before that in the ER suggesting new fluid collection in intramuscular abdominal wall at the level umbilicus and nodular contour of liver   Patient reports drinking beer twice a week.  Denies any herbal medication use   Last labs from 02/2024 AST 162 ALT 145 alk phos 155 T. bili 0.4 Hemoglobin 12.4   The patient denies having any nausea, vomiting, fever, chills, hematochezia, melena, hematemesis, abdominal distention, abdominal pain, diarrhea, jaundice, pruritus or weight loss.   Last ZHI:wnwz Last Colonoscopy:many years back at OSH as per patient and reports that she is due    FHx: neg for any gastrointestinal/liver disease, no malignancies Social: Octavio twice a week Surgical: Right inguinal hernia repair, hysterectomy  Past Medical History:  Diagnosis Date   Hypertension    noncompliant 09/2011 due to no money   PONV (postoperative nausea and vomiting)    Trichomonas 11/21/2010   Rx'd flagyl  09/2011   Uterine fibroid     Past Surgical History:  Procedure Laterality Date   ABDOMINAL HYSTERECTOMY     CESAREAN SECTION  1983   [address], [place]   ROBOTIC ASSISTED LAPAROSCOPIC LYSIS OF ADHESION  01/30/2024   Procedure: LYSIS, ADHESIONS, ROBOT-ASSISTED,  LAPAROSCOPIC;  Surgeon: Evonnie Dorothyann LABOR, DO;  Location: [facility];  Service: General;;   SUPRACERVICAL ABDOMINAL HYSTERECTOMY  01/10/2012   Procedure: HYSTERECTOMY SUPRACERVICAL ABDOMINAL;  Surgeon: Norleen LULLA Server, MD;  Location: [facility];  Service: Gynecology;  Laterality: N/A;  total abdominal hysterectomy with left oophorectomy   TUBAL LIGATION     at last cesarean   XI ROBOTIC ASSISTED INGUINAL HERNIA REPAIR WITH MESH Right 01/30/2024   Procedure: REPAIR, HERNIA, INGUINAL, ROBOT-ASSISTED, LAPAROSCOPIC WITH MESH;  Surgeon: Evonnie Dorothyann LABOR, DO;  Location: [facility];  Service: General;  Laterality: Right;    Prior to Admission medications   Medication Sig Start Date End Date Taking? Authorizing Provider  aspirin  EC 81 MG tablet Take 1 tablet (81 mg total) by mouth daily. 02/02/24  Yes Pappayliou, Dorothyann LABOR, DO  atorvastatin  (LIPITOR) 80 MG tablet Take 80 mg by mouth daily. 07/18/24  Yes [provider]  losartan  (COZAAR ) 100 MG tablet Take 1 tablet (100 mg total) by mouth daily. 07/23/24  Yes Grooms, Lynn, PA-C    Allergies as of 07/02/2024   (No Known Allergies)    Family History  Problem Relation Age of Onset   Hypertension Mother    Heart failure Mother    Anesthesia problems Neg Hx    Hypotension Neg Hx    Malignant hyperthermia Neg Hx    Pseudochol deficiency Neg Hx     Social History   Socioeconomic History   Marital status: Widowed    Spouse name: Not on file   Number of children: Not on file   Years of education: Not  on file   Highest education level: Not on file  Occupational History   Not on file  Tobacco Use   Smoking status: Every Day    Current packs/day: 0.25    Average packs/day: 0.2 packs/day for 46.1 years (11.5 ttl pk-yrs)    Types: Cigarettes    Start date: 07/25/1978   Smokeless tobacco: Never   Tobacco comments:    1 cigarette daily   Vaping Use   Vaping status: Never Used  Substance and Sexual Activity   Alcohol use: Yes     Alcohol/week: 2.0 - 3.0 standard drinks of alcohol    Types: 2 - 3 Cans of beer per week    Comment: daily    Drug use: No   Sexual activity: Not Currently  Other Topics Concern   Not on file  Social History Narrative   Not on file   Social Drivers of Health   Financial Resource Strain: Not on file  Food Insecurity: No Food Insecurity (01/30/2024)   Hunger Vital Sign    Worried About Running Out of Food in the Last Year: Never true    Ran Out of Food in the Last Year: Never true  Transportation Needs: No Transportation Needs (01/30/2024)   PRAPARE - Administrator, Civil Service (Medical): No    Lack of Transportation (Non-Medical): No  Physical Activity: Not on file  Stress: Not on file  Social Connections: Not on file  Intimate Partner Violence: Not At Risk (01/30/2024)   Humiliation, Afraid, Rape, and Kick questionnaire    Fear of Current or Ex-Partner: No    Emotionally Abused: No    Physically Abused: No    Sexually Abused: No    Review of Systems: See HPI, otherwise negative ROS  Physical Exam: Vital signs in last 24 hours: Temp:  [98.7 F (37.1 C)] 98.7 F (37.1 C) (09/26 0806) Pulse Rate:  [70] 70 (09/26 0806) Resp:  [22] 22 (09/26 0806) BP: (133)/(86) 133/86 (09/26 0806) SpO2:  [99 %] 99 % (09/26 0806) Weight:  [74.4 kg] 74.4 kg (09/26 0806)   General:   Alert,  Well-developed, well-nourished, pleasant and cooperative in NAD Head:  Normocephalic and atraumatic. Eyes:  Sclera clear, no icterus.   Conjunctiva pink. Ears:  Normal auditory acuity. Nose:  No deformity, discharge,  or lesions. Msk:  Symmetrical without gross deformities. Normal posture. Extremities:  Without clubbing or edema. Neurologic:  Alert and  oriented x4;  grossly normal neurologically. Skin:  Intact without significant lesions or rashes. Psych:  Alert and cooperative. Normal mood and affect.  Impression/Plan: Kim Richardson is a 63 y.o. female, hypertension, coronary  artery disease on aspirin  who presents for evaluation of abdominal pain and screening colonoscopy  Proceed with EGD and Colonoscopy   The risks of the procedure including infection, bleed, or perforation as well as benefits, limitations, alternatives and imponderables have been reviewed with the patient. Questions have been answered. All parties agreeable.

## 2024-08-16 NOTE — Anesthesia Preprocedure Evaluation (Addendum)
 Anesthesia Evaluation  Patient identified by MRN, date of birth, ID band Patient awake    Reviewed: Allergy & Precautions, H&P , NPO status , Patient's Chart, lab work & pertinent test results, reviewed documented beta blocker date and time   History of Anesthesia Complications (+) PONV and history of anesthetic complications  Airway Mallampati: II  TM Distance: >3 FB Neck ROM: full    Dental no notable dental hx.    Pulmonary Current Smoker   Pulmonary exam normal breath sounds clear to auscultation       Cardiovascular Exercise Tolerance: Good hypertension, + CAD   Rhythm:regular Rate:Normal     Neuro/Psych negative neurological ROS  negative psych ROS   GI/Hepatic negative GI ROS, Neg liver ROS,,,  Endo/Other  diabetes    Renal/GU negative Renal ROS  negative genitourinary   Musculoskeletal   Abdominal   Peds  Hematology negative hematology ROS (+)   Anesthesia Other Findings   Reproductive/Obstetrics negative OB ROS                              Anesthesia Physical Anesthesia Plan  ASA: 2  Anesthesia Plan: General   Post-op Pain Management:    Induction:   PONV Risk Score and Plan: Propofol  infusion  Airway Management Planned:   Additional Equipment:   Intra-op Plan:   Post-operative Plan:   Informed Consent: I have reviewed the patients History and Physical, chart, labs and discussed the procedure including the risks, benefits and alternatives for the proposed anesthesia with the patient or authorized representative who has indicated his/her understanding and acceptance.     Dental Advisory Given  Plan Discussed with: CRNA  Anesthesia Plan Comments:          Anesthesia Quick Evaluation

## 2024-08-19 ENCOUNTER — Encounter (INDEPENDENT_AMBULATORY_CARE_PROVIDER_SITE_OTHER): Payer: Self-pay | Admitting: *Deleted

## 2024-08-19 ENCOUNTER — Encounter (HOSPITAL_COMMUNITY): Payer: Self-pay | Admitting: Gastroenterology

## 2024-08-19 LAB — SURGICAL PATHOLOGY

## 2024-08-21 ENCOUNTER — Telehealth (INDEPENDENT_AMBULATORY_CARE_PROVIDER_SITE_OTHER): Payer: Self-pay | Admitting: Gastroenterology

## 2024-08-21 ENCOUNTER — Ambulatory Visit (INDEPENDENT_AMBULATORY_CARE_PROVIDER_SITE_OTHER): Payer: Self-pay | Admitting: Gastroenterology

## 2024-08-21 DIAGNOSIS — A048 Other specified bacterial intestinal infections: Secondary | ICD-10-CM

## 2024-08-21 MED ORDER — OMEPRAZOLE 20 MG PO CPDR: 20.0000 mg | DELAYED_RELEASE_CAPSULE | Freq: Two times a day (BID) | ORAL | 0 refills | Status: DC

## 2024-08-21 MED ORDER — BISMUTH/METRONIDAZ/TETRACYCLIN 140-125-125 MG PO CAPS: 3.0000 | ORAL_CAPSULE | Freq: Four times a day (QID) | ORAL | 0 refills | Status: DC

## 2024-08-21 NOTE — Progress Notes (Signed)
 3 yr TCS noted in recall Patient result letter mailed Patient's PCP is on EPIC

## 2024-08-21 NOTE — Telephone Encounter (Signed)
 PA completed via cover my meds for Pylera. Awaiting decision.

## 2024-08-22 ENCOUNTER — Ambulatory Visit: Admitting: Physician Assistant

## 2024-08-22 NOTE — Telephone Encounter (Signed)
 Fax from [facility] stating that This drug is approved on the the Preferred Drug List. It does not require prior approval. Please instruct pharmacy to process brand Pylera capsules instead of generic. Contacted [employer] and spoke to Cicero is able to process it for brand name but he will have to order it sine they do not keep Pylera in stock;should be in tomorrow. Contacted pt and advised her and explained what H.Pylori is and the medication. Pt asked that we call her daughter Ora and explain to her. [facility] and advised her of the medication that was sent in and what H.Pylori is. Daugther also stated that patient had an appointment this morning but she is unable to bring her so they need to cancel and reschedule. Pt appt is with [facility] with her PCP-I have sent a secure chat to [facility] to let them know.

## 2024-08-27 ENCOUNTER — Ambulatory Visit: Admitting: Physician Assistant

## 2024-09-26 ENCOUNTER — Ambulatory Visit (INDEPENDENT_AMBULATORY_CARE_PROVIDER_SITE_OTHER): Admitting: Gastroenterology

## 2024-10-22 ENCOUNTER — Encounter (INDEPENDENT_AMBULATORY_CARE_PROVIDER_SITE_OTHER): Payer: Self-pay | Admitting: Gastroenterology

## 2024-10-22 ENCOUNTER — Ambulatory Visit (INDEPENDENT_AMBULATORY_CARE_PROVIDER_SITE_OTHER): Admitting: Gastroenterology

## 2024-10-22 VITALS — BP 137/87 | HR 73 | Temp 97.1°F | Ht 64.0 in | Wt 163.3 lb

## 2024-10-22 DIAGNOSIS — R748 Abnormal levels of other serum enzymes: Secondary | ICD-10-CM

## 2024-10-22 DIAGNOSIS — K760 Fatty (change of) liver, not elsewhere classified: Secondary | ICD-10-CM

## 2024-10-22 DIAGNOSIS — R1010 Upper abdominal pain, unspecified: Secondary | ICD-10-CM

## 2024-10-22 DIAGNOSIS — B9681 Helicobacter pylori [H. pylori] as the cause of diseases classified elsewhere: Secondary | ICD-10-CM

## 2024-10-22 DIAGNOSIS — K2970 Gastritis, unspecified, without bleeding: Secondary | ICD-10-CM

## 2024-10-22 DIAGNOSIS — A048 Other specified bacterial intestinal infections: Secondary | ICD-10-CM | POA: Insufficient documentation

## 2024-10-22 DIAGNOSIS — R197 Diarrhea, unspecified: Secondary | ICD-10-CM

## 2024-10-22 MED ORDER — OMEPRAZOLE 40 MG PO CPDR: 40.0000 mg | DELAYED_RELEASE_CAPSULE | Freq: Every day | ORAL | 3 refills | Status: AC

## 2024-10-22 NOTE — Patient Instructions (Signed)
 Please complete h pylori testing at the lab as well as blood testing for your liver that were previously ordered by Dr. Cinderella  Once you have done the labs, I want you to restart the omeprazole  40mg  daily, take this in the morning 30 minutes before eating  If diarrhea is not improving over the next 4-5 days, please make me aware  Follow up 3 months

## 2024-10-22 NOTE — Progress Notes (Signed)
 Referring Provider: Grooms, Charmaine RIG[place] Primary Care Physician:  Grooms, Charmaine, [place] Primary GI Physician: Dr. Cinderella   Chief Complaint  Patient presents with   Abdominal Pain    Patient here today due to having issues with abdominal pain in abdominal pain on each side periumbilical area, and just above the umbilical area. Denies any nausea,vomiting, fevers. Had some loser stools yesterday, but not usual. Was recently treated for H pylori and completed her regimen.     HPI:   Kim Richardson is a 63 y.o. female with past medical history of HTN, CAD on aspirin    Patient presenting today for:  Follow up of abdominal pain, H pylori infection/gastritis, hepatic steatosis/elevated LFTs   Last seen August, by DR. Ahmed, at that time having epigastric and RUQ pain, occasional constipation which she takes dulcolax for, recent CT with nodular contour of liver, drinks beer twice per week, recent labs in April with elevated LFTs with AST 162 ALT 145 Alk phos 155 t bili 0.4  recommended EGD, metamucil, IBgard, referral for abdominal pain trigger point injections if not improving, further liver serologies, abd us  elastography, Colonoscopy   Last Colonoscopy:07/2024- One 5 mm polyp in the transverse colon, removed                            with a cold snare. Resected and retrieved.                           - Diverticulosis in the left colon.                           - One 15 mm polyp in the sigmoid colon, removed                            with a hot snare. Resected and retrieved.                           - Non-bleeding internal hemorrhoids. Last EGD:07/2024- Normal esophagus.                           - Gastritis. Biopsied.                           - Normal duodenal bulb and second portion of the                            duodenum.  A. STOMACH, BIOPSY:  Moderate chronic gastritis with lymphoid aggregates  Helicobacter present  Negative for intestinal metaplasia, dysplasia and  carcinoma   B. TRANSVERSE AND SIGMOID COLON, POLYPECTOMY:  Tubular adenoma  Negative for high-grade dysplasia and carcinoma   Repeat TCS 3 years  Sent bismuth  quadruple therapy for H pylori treatment  Liver serologies not completed  US  elastography 07/04/24 hepatic steatosis, median Kpa 3.9, recommended repeat US /fibroscan in 6-12 months  Present:  States she completed h pylori therapy. Did not really notice much improvement in her abdominal pain. Continues to have RUQ and LUQ as well as epigastric pain. No nausea or vomiting. No changes in appetite. Does not feel that eating changes pain. Pain comes and goes. She denies any GERD  symptoms. Not taking PPI currently. She had some diarrhea yesterday x1 but none on a regular basis. Denies rectal bleeding or melena. No otc meds/NSAIDs.     Filed Weights   10/22/24 1050  Weight: 163 lb 4.8 oz (74.1 kg)     Past Medical History:  Diagnosis Date   Hypertension    noncompliant 09/2011 due to no money   PONV (postoperative nausea and vomiting)    Trichomonas 11/21/2010   Rx'd flagyl  09/2011   Uterine fibroid     Past Surgical History:  Procedure Laterality Date   ABDOMINAL HYSTERECTOMY     CESAREAN SECTION  [address]   [place], [place]   COLONOSCOPY N/A 08/16/2024   Procedure: COLONOSCOPY;  Surgeon: Cinderella Deatrice FALCON, MD;  [facility]: [facility];  Service: Endoscopy;  Laterality: N/A;  9:15 am ,asa 1-2   ESOPHAGOGASTRODUODENOSCOPY N/A 08/16/2024   Procedure: EGD (ESOPHAGOGASTRODUODENOSCOPY);  Surgeon: Cinderella Deatrice FALCON, MD;  [facility]: [facility];  Service: Endoscopy;  Laterality: N/A;   ROBOTIC ASSISTED LAPAROSCOPIC LYSIS OF ADHESION  01/30/2024   Procedure: LYSIS, ADHESIONS, ROBOT-ASSISTED, LAPAROSCOPIC;  Surgeon: Evonnie Dorothyann LABOR, DO;  Location: [facility];  Service: General;;   SUPRACERVICAL ABDOMINAL HYSTERECTOMY  01/10/2012   Procedure: HYSTERECTOMY SUPRACERVICAL ABDOMINAL;  Surgeon: Norleen LULLA Server, MD;  Location: [facility];   Service: Gynecology;  Laterality: N/A;  total abdominal hysterectomy with left oophorectomy   TUBAL LIGATION     at last cesarean   XI ROBOTIC ASSISTED INGUINAL HERNIA REPAIR WITH MESH Right 01/30/2024   Procedure: REPAIR, HERNIA, INGUINAL, ROBOT-ASSISTED, LAPAROSCOPIC WITH MESH;  Surgeon: Evonnie Dorothyann LABOR, DO;  Location: [facility];  Service: General;  Laterality: Right;    Current Outpatient Medications  Medication Sig Dispense Refill   aspirin  EC 81 MG tablet Take 1 tablet (81 mg total) by mouth daily.     atorvastatin  (LIPITOR) 80 MG tablet Take 80 mg by mouth daily.     losartan  (COZAAR ) 100 MG tablet Take 1 tablet (100 mg total) by mouth daily. 90 tablet 1   omeprazole  (PRILOSEC) 20 MG capsule Take 1 capsule (20 mg total) by mouth 2 (two) times daily before a meal for 14 days. Take 30 min before breakfast and 30 min before dinner (Patient not taking: Reported on 10/22/2024) 28 capsule 0   No current facility-administered medications for this visit.    Allergies as of 10/22/2024   (No Known Allergies)    Social History   Socioeconomic History   Marital status: Widowed    Spouse name: Not on file   Number of children: Not on file   Years of education: Not on file   Highest education level: Not on file  Occupational History   Not on file  Tobacco Use   Smoking status: Every Day    Current packs/day: 0.25    Average packs/day: 0.3 packs/day for 46.2 years (11.6 ttl pk-yrs)    Types: Cigarettes    Start date: 07/25/1978   Smokeless tobacco: Never   Tobacco comments:    1 cigarette daily   Vaping Use   Vaping status: Never Used  Substance and Sexual Activity   Alcohol use: Yes    Alcohol/week: 2.0 - 3.0 standard drinks of alcohol    Types: 2 - 3 Cans of beer per week    Comment: daily    Drug use: No   Sexual activity: Not Currently  Other Topics Concern   Not on file  Social History Narrative   Not  on file   Social Drivers of Health   Financial Resource Strain:  Not on file  Food Insecurity: No Food Insecurity (01/30/2024)   Hunger Vital Sign    Worried About Running Out of Food in the Last Year: Never true    Ran Out of Food in the Last Year: Never true  Transportation Needs: No Transportation Needs (01/30/2024)   PRAPARE - Administrator, Civil Service (Medical): No    Lack of Transportation (Non-Medical): No  Physical Activity: Not on file  Stress: Not on file  Social Connections: Not on file    Review of systems General: negative for malaise, night sweats, fever, chills, weight loss Neck: Negative for lumps, goiter, pain and significant neck swelling Resp: Negative for cough, wheezing, dyspnea at rest CV: Negative for chest pain, leg swelling, palpitations, orthopnea GI: denies melena, hematochezia, nausea, vomiting, constipation, dysphagia, odyonophagia, early satiety or unintentional weight loss. +upper abdominal pain +diarrhea x1 MSK: Negative for joint pain or swelling, back pain, and muscle pain. Derm: Negative for itching or rash Psych: Denies depression, anxiety, memory loss, confusion. No homicidal or suicidal ideation.  Heme: Negative for prolonged bleeding, bruising easily, and swollen nodes. Endocrine: Negative for cold or heat intolerance, polyuria, polydipsia and goiter. Neuro: negative for tremor, gait imbalance, syncope and seizures. The remainder of the review of systems is noncontributory.  Physical Exam: BP 137/87 (BP Location: Left Arm, Patient Position: Sitting, Cuff Size: Large)   Pulse 73   Temp (!) 97.1 F (36.2 C) (Temporal)   Ht 5' 4 (1.626 m)   Wt 163 lb 4.8 oz (74.1 kg)   BMI 28.03 kg/m  General:   Alert and oriented. No distress noted. Pleasant and cooperative.  Head:  Normocephalic and atraumatic. Eyes:  Conjuctiva clear without scleral icterus. Mouth:  Oral mucosa pink and moist. Good dentition. No lesions. Heart: Normal rate and rhythm, s1 and s2 heart sounds present.  Lungs: Clear lung  sounds in all lobes. Respirations equal and unlabored. Abdomen:  +BS, soft, and non-distended. TTP of upper abdomen. No rebound or guarding. No HSM or masses noted. Derm: No palmar erythema or jaundice Msk:  Symmetrical without gross deformities. Normal posture. Extremities:  Without edema. Neurologic:  Alert and  oriented x4 Psych:  Alert and cooperative. Normal mood and affect.  Invalid input(s): 6 MONTHS   ASSESSMENT: RIVERS GASSMANN is a 63 y.o. female presenting today for follow up of abdominal pain, H pylori infection/gastritis and elevated LFTs/ Hepatic steatosis  Upper abdominal pain/h Pylori infection: recent EGD with h pylori infection/gastritis, treated with quadruple bismuth  therapy which she reports she took to completion. Not taking PPI currently, continues to have upper abdominal pain as before. Will need H pylori breath test to confirm if infection has been eradicated or not, this may still be the source of her pain. Will have her resume PPI therapy after breath testing, further recommendations to follow upon lab results.  Elevated LFTs/Hepatic steatosis: previous CT concerning for nodular liver border, she had US  elastography with low median kpa, hepatic steatosis, recommended repeat US  or fibroscan in 6-12 months, I would opt for fibroscan for better determination of any presence of fibrosis, will plan to do this in about 6 months. She has not completed previously ordered serologies for elevated LFTs which I again encouraged her to do today.   Reports 1 episode of diarrhea yesterday, no nausea or vomiting, may be food intake related. I encouraged her to let me know if  she continues to have diarrhea past the next few days, as we can do further testing at that time if needed.  PLAN:  -H pylori breath testing -resume omeprazole  40mg  daily after H pylori testing  -consider abdominal pain trigger point injections if pain persist once eradication of H pylori confirmed  -complete  previously ordered liver serologies -consider liver fibroscan in about 6 months -pt to make me aware if diarrhea persists  All questions were answered, patient verbalized understanding and is in agreement with plan as outlined above.    Follow Up: 3 months   Kim Termine L. Chance Munter, MSN, APRN, AGNP-C Adult-Gerontology Nurse Practitioner [facility]

## 2024-12-12 ENCOUNTER — Encounter (INDEPENDENT_AMBULATORY_CARE_PROVIDER_SITE_OTHER): Payer: Self-pay | Admitting: *Deleted
